# Patient Record
Sex: Female | Born: 1980 | Hispanic: No | Marital: Married | Smoking: Never smoker
Health system: Southern US, Community
[De-identification: ages and names within clinical notes are randomized; demographics above are authoritative.]

---

## 2017-12-09 ENCOUNTER — Ambulatory Visit (INDEPENDENT_AMBULATORY_CARE_PROVIDER_SITE_OTHER): Payer: Self-pay | Admitting: Obstetrics and Gynecology

## 2017-12-09 ENCOUNTER — Encounter: Payer: Self-pay | Admitting: Obstetrics and Gynecology

## 2017-12-09 VITALS — BP 114/78 | Wt 189.0 lb

## 2017-12-09 DIAGNOSIS — O09529 Supervision of elderly multigravida, unspecified trimester: Secondary | ICD-10-CM

## 2017-12-09 DIAGNOSIS — O09523 Supervision of elderly multigravida, third trimester: Secondary | ICD-10-CM

## 2017-12-09 DIAGNOSIS — Z3A31 31 weeks gestation of pregnancy: Secondary | ICD-10-CM

## 2017-12-09 DIAGNOSIS — Z3480 Encounter for supervision of other normal pregnancy, unspecified trimester: Secondary | ICD-10-CM

## 2017-12-09 NOTE — Progress Notes (Signed)
New Obstetric Patient H&P    Chief Complaint: "Desires prenatal care"   History of Present Illness: Patient is a 37 y.o. No obstetric history on file. Not Hispanic or Latino female, presents with amenorrhea and positive home pregnancy test. Based on her  LMP 04/30/2017, her Estimated Date of Delivery:02/04/2018. and her EGA is 31 weeks 6 days. She underwent ultrasound at 22 weeks at 10/02/2017 showing a single intrauterine pregnancy in vertex presentation, anterior placenta, normal amniotic fluid volume, and normal visualized fetal anatomy.  [place] in [place].     Her past medical history is noncontributory. Her prior pregnancies are notable for none  Since her LMP, she admits to the use of tobacco products  no There are cats in the home in the home  no  She admits close contact with children on a regular basis  yes  She has had chicken pox in the past unknown She has had Tuberculosis exposures, symptoms, or previously tested positive for TB   no Current or past history of domestic violence. no  Genetic Screening/Teratology Counseling: (Includes patient, baby's father, or anyone in either family with:)   1. [facility] 36 [facility]  yes 2. Thalassemia ([place], [place], Mediterranean, or Asian background): MCV<80  no 3. Neural tube defect (meningomyelocele, spina bifida, anencephaly)  no 4. Congenital heart defect  no  5. Down syndrome  no 6. Tay-Sachs (Jewish, [place])  no 7. Canavan's Disease  no 8. Sickle cell disease or trait (African)  no  9. Hemophilia or other blood disorders  no  10. Muscular dystrophy  no  11. Cystic fibrosis  no  12. Huntington's Chorea  no  13. Mental retardation/autism  no 14. Other inherited genetic or chromosomal disorder  no 15. Maternal metabolic disorder (DM, PKU, etc)  no 16. Patient or FOB with a child with a birth defect not listed above no  16a. Patient or FOB with a birth defect themselves no 17. Recurrent pregnancy  loss, or stillbirth  no  18. Any medications since LMP other than prenatal vitamins (include vitamins, supplements, OTC meds, drugs, alcohol)  no 19. Any other genetic/environmental exposure to discuss  no  Infection History:   1. Lives with someone with TB or TB exposed  no  2. Patient or partner has history of genital herpes  no 3. Rash or viral illness since LMP  no 4. History of STI (GC, CT, HPV, syphilis, HIV)  no 5. History of recent travel :  yes  Other pertinent information:  no     Review of Systems:10 point review of systems negative unless otherwise noted in HPI  Past Medical History:  History reviewed. No pertinent past medical history.  Past Surgical History:  History reviewed. No pertinent surgical history.  Gynecologic History: Patient's last menstrual period was 04/30/2017 (exact date).  Obstetric History: No obstetric history on file.  Family History:  History reviewed. No pertinent family history.  Social History:  Social History   Socioeconomic History  . Marital status: Married    Spouse name: Not on file  . Number of children: Not on file  . Years of education: Not on file  . Highest education level: Not on file  Occupational History  . Not on file  Social Needs  . Financial resource strain: Not on file  . Food insecurity:    Worry: Not on file    Inability: Not on file  . Transportation needs:    Medical: Not on  file    Non-medical: Not on file  Tobacco Use  . Smoking status: Never Smoker  . Smokeless tobacco: Never Used  Substance and Sexual Activity  . Alcohol use: Yes    Comment: Occ  . Drug use: Never  . Sexual activity: Yes  Lifestyle  . Physical activity:    Days per week: Not on file    Minutes per session: Not on file  . Stress: Not on file  Relationships  . Social connections:    Talks on phone: Not on file    Gets together: Not on file    Attends religious service: Not on file    Active member of club or organization:  Not on file    Attends meetings of clubs or organizations: Not on file    Relationship status: Not on file  . Intimate partner violence:    Fear of current or ex partner: Not on file    Emotionally abused: Not on file    Physically abused: Not on file    Forced sexual activity: Not on file  Other Topics Concern  . Not on file  Social History Narrative  . Not on file    Allergies:  No Known Allergies  Medications: Prior to Admission medications   Not on File    Physical Exam Blood pressure 114/78, weight 189 lb (85.7 kg), last menstrual period 04/30/2017.   General: NAD HEENT: normocephalic, anicteric Pulmonary: No increased work of breathing, CTAB Abdomen: Gravid, non-tender, fundal height 31cm, FHT 140 Neurologic: Grossly intact Psychiatric: mood appropriate, affect full   Assessment: 37 y.o. G4P3 at [redacted]w[redacted]d presenting to initiate prenatal care  Plan: 36) Avoid alcoholic beverages. 2) Patient encouraged not to smoke.  3) Discontinue the use of all non-medicinal drugs and chemicals.  4) Take prenatal vitamins daily.  5) Nutrition, food safety (fish, cheese advisories, and high nitrite foods) and exercise discussed. 6) Hospital and practice style discussed with cross coverage system.  7) Prenatal records reviewed.   8) Continue ASA for pre-eclampsia risk reduction 9) Has received tetanus vaccination x 2, malaria prophylaxis x 2 9) Return for labs and anatomy scan in 1 week  Vena Austria, MD, [facility], [facility]

## 2017-12-09 NOTE — Progress Notes (Signed)
NOB transfer  

## 2017-12-21 ENCOUNTER — Ambulatory Visit (INDEPENDENT_AMBULATORY_CARE_PROVIDER_SITE_OTHER): Payer: Self-pay

## 2017-12-21 ENCOUNTER — Ambulatory Visit (INDEPENDENT_AMBULATORY_CARE_PROVIDER_SITE_OTHER): Payer: Self-pay | Admitting: Obstetrics & Gynecology

## 2017-12-21 VITALS — BP 100/60 | Ht 66.0 in | Wt 184.0 lb

## 2017-12-21 DIAGNOSIS — O09523 Supervision of elderly multigravida, third trimester: Secondary | ICD-10-CM

## 2017-12-21 DIAGNOSIS — Z3A33 33 weeks gestation of pregnancy: Secondary | ICD-10-CM

## 2017-12-21 DIAGNOSIS — Z363 Encounter for antenatal screening for malformations: Secondary | ICD-10-CM

## 2017-12-21 DIAGNOSIS — O09529 Supervision of elderly multigravida, unspecified trimester: Secondary | ICD-10-CM

## 2017-12-21 DIAGNOSIS — Z3A31 31 weeks gestation of pregnancy: Secondary | ICD-10-CM

## 2017-12-21 DIAGNOSIS — Z3480 Encounter for supervision of other normal pregnancy, unspecified trimester: Secondary | ICD-10-CM

## 2017-12-21 DIAGNOSIS — O0993 Supervision of high risk pregnancy, unspecified, third trimester: Secondary | ICD-10-CM

## 2017-12-21 NOTE — Patient Instructions (Signed)
Third Trimester of Pregnancy The third trimester is from week 28 through week 40 (months 7 through 9). The third trimester is a time when the unborn baby (fetus) is growing rapidly. At the end of the ninth month, the fetus is about 20 inches in length and weighs 6-10 pounds. Body changes during your third trimester Your body will continue to go through many changes during pregnancy. The changes vary from woman to woman. During the third trimester:  Your weight will continue to increase. You can expect to gain 25-35 pounds (11-16 kg) by the end of the pregnancy.  You may begin to get stretch marks on your hips, abdomen, and breasts.  You may urinate more often because the fetus is moving lower into your pelvis and pressing on your bladder.  You may develop or continue to have heartburn. This is caused by increased hormones that slow down muscles in the digestive tract.  You may develop or continue to have constipation because increased hormones slow digestion and cause the muscles that push waste through your intestines to relax.  You may develop hemorrhoids. These are swollen veins (varicose veins) in the rectum that can itch or be painful.  You may develop swollen, bulging veins (varicose veins) in your legs.  You may have increased body aches in the pelvis, back, or thighs. This is due to weight gain and increased hormones that are relaxing your joints.  You may have changes in your hair. These can include thickening of your hair, rapid growth, and changes in texture. Some women also have hair loss during or after pregnancy, or hair that feels dry or thin. Your hair will most likely return to normal after your baby is born.  Your breasts will continue to grow and they will continue to become tender. A yellow fluid (colostrum) may leak from your breasts. This is the first milk you are producing for your baby.  Your belly button may stick out.  You may notice more swelling in your hands,  face, or ankles.  You may have increased tingling or numbness in your hands, arms, and legs. The skin on your belly may also feel numb.  You may feel short of breath because of your expanding uterus.  You may have more problems sleeping. This can be caused by the size of your belly, increased need to urinate, and an increase in your body's metabolism.  You may notice the fetus "dropping," or moving lower in your abdomen (lightening).  You may have increased vaginal discharge.  You may notice your joints feel loose and you may have pain around your pelvic bone.  What to expect at prenatal visits You will have prenatal exams every 2 weeks until week 36. Then you will have weekly prenatal exams. During a routine prenatal visit:  You will be weighed to make sure you and the baby are growing normally.  Your blood pressure will be taken.  Your abdomen will be measured to track your baby's growth.  The fetal heartbeat will be listened to.  Any test results from the previous visit will be discussed.  You may have a cervical check near your due date to see if your cervix has softened or thinned (effaced).  You will be tested for Group B streptococcus. This happens between 35 and 37 weeks.  Your health care provider may ask you:  What your birth plan is.  How you are feeling.  If you are feeling the baby move.  If you have had   any abnormal symptoms, such as leaking fluid, bleeding, severe headaches, or abdominal cramping.  If you are using any tobacco products, including cigarettes, chewing tobacco, and electronic cigarettes.  If you have any questions.  Other tests or screenings that may be performed during your third trimester include:  Blood tests that check for low iron levels (anemia).  Fetal testing to check the health, activity level, and growth of the fetus. Testing is done if you have certain medical conditions or if there are problems during the  pregnancy.  Nonstress test (NST). This test checks the health of your baby to make sure there are no signs of problems, such as the baby not getting enough oxygen. During this test, a belt is placed around your belly. The baby is made to move, and its heart rate is monitored during movement.  What is false labor? False labor is a condition in which you feel small, irregular tightenings of the muscles in the womb (contractions) that usually go away with rest, changing position, or drinking water. These are called Braxton Hicks contractions. Contractions may last for hours, days, or even weeks before true labor sets in. If contractions come at regular intervals, become more frequent, increase in intensity, or become painful, you should see your health care provider. What are the signs of labor?  Abdominal cramps.  Regular contractions that start at 10 minutes apart and become stronger and more frequent with time.  Contractions that start on the top of the uterus and spread down to the lower abdomen and back.  Increased pelvic pressure and dull back pain.  A watery or bloody mucus discharge that comes from the vagina.  Leaking of amniotic fluid. This is also known as your "water breaking." It could be a slow trickle or a gush. Let your health care provider know if it has a color or strange odor. If you have any of these signs, call your health care provider right away, even if it is before your due date. Follow these instructions at home: Medicines  Follow your health care provider's instructions regarding medicine use. Specific medicines may be either safe or unsafe to take during pregnancy.  Take a prenatal vitamin that contains at least 600 micrograms (mcg) of folic acid.  If you develop constipation, try taking a stool softener if your health care provider approves. Eating and drinking  Eat a balanced diet that includes fresh fruits and vegetables, whole grains, good sources of protein  such as meat, eggs, or tofu, and low-fat dairy. Your health care provider will help you determine the amount of weight gain that is right for you.  Avoid raw meat and uncooked cheese. These carry germs that can cause birth defects in the baby.  If you have low calcium intake from food, talk to your health care provider about whether you should take a daily calcium supplement.  Eat four or five small meals rather than three large meals a day.  Limit foods that are high in fat and processed sugars, such as fried and sweet foods.  To prevent constipation: ? Drink enough fluid to keep your urine clear or pale yellow. ? Eat foods that are high in fiber, such as fresh fruits and vegetables, whole grains, and beans. Activity  Exercise only as directed by your health care provider. Most women can continue their usual exercise routine during pregnancy. Try to exercise for 30 minutes at least 5 days a week. Stop exercising if you experience uterine contractions.  Avoid heavy   lifting.  Do not exercise in extreme heat or humidity, or at high altitudes.  Wear low-heel, comfortable shoes.  Practice good posture.  You may continue to have sex unless your health care provider tells you otherwise. Relieving pain and discomfort  Take frequent breaks and rest with your legs elevated if you have leg cramps or low back pain.  Take warm sitz baths to soothe any pain or discomfort caused by hemorrhoids. Use hemorrhoid cream if your health care provider approves.  Wear a good support bra to prevent discomfort from breast tenderness.  If you develop varicose veins: ? Wear support pantyhose or compression stockings as told by your healthcare provider. ? Elevate your feet for 15 minutes, 3-4 times a day. Prenatal care  Write down your questions. Take them to your prenatal visits.  Keep all your prenatal visits as told by your health care provider. This is important. Safety  Wear your seat belt at  all times when driving.  Make a list of emergency phone numbers, including numbers for family, friends, the hospital, and police and fire departments. General instructions  Avoid cat litter boxes and soil used by cats. These carry germs that can cause birth defects in the baby. If you have a cat, ask someone to clean the litter box for you.  Do not travel far distances unless it is absolutely necessary and only with the approval of your health care provider.  Do not use hot tubs, steam rooms, or saunas.  Do not drink alcohol.  Do not use any products that contain nicotine or tobacco, such as cigarettes and e-cigarettes. If you need help quitting, ask your health care provider.  Do not use any medicinal herbs or unprescribed drugs. These chemicals affect the formation and growth of the baby.  Do not douche or use tampons or scented sanitary pads.  Do not cross your legs for long periods of time.  To prepare for the arrival of your baby: ? Take prenatal classes to understand, practice, and ask questions about labor and delivery. ? Make a trial run to the hospital. ? Visit the hospital and tour the maternity area. ? Arrange for maternity or paternity leave through employers. ? Arrange for family and friends to take care of pets while you are in the hospital. ? Purchase a rear-facing car seat and make sure you know how to install it in your car. ? Pack your hospital bag. ? Prepare the baby's nursery. Make sure to remove all pillows and stuffed animals from the baby's crib to prevent suffocation.  Visit your dentist if you have not gone during your pregnancy. Use a soft toothbrush to brush your teeth and be gentle when you floss. Contact a health care provider if:  You are unsure if you are in labor or if your water has broken.  You become dizzy.  You have mild pelvic cramps, pelvic pressure, or nagging pain in your abdominal area.  You have lower back pain.  You have persistent  nausea, vomiting, or diarrhea.  You have an unusual or bad smelling vaginal discharge.  You have pain when you urinate. Get help right away if:  Your water breaks before 37 weeks.  You have regular contractions less than 5 minutes apart before 37 weeks.  You have a fever.  You are leaking fluid from your vagina.  You have spotting or bleeding from your vagina.  You have severe abdominal pain or cramping.  You have rapid weight loss or weight gain.    You have shortness of breath with chest pain.  You notice sudden or extreme swelling of your face, hands, ankles, feet, or legs.  Your baby makes fewer than 10 movements in 2 hours.  You have severe headaches that do not go away when you take medicine.  You have vision changes. Summary  The third trimester is from week 28 through week 40, months 7 through 9. The third trimester is a time when the unborn baby (fetus) is growing rapidly.  During the third trimester, your discomfort may increase as you and your baby continue to gain weight. You may have abdominal, leg, and back pain, sleeping problems, and an increased need to urinate.  During the third trimester your breasts will keep growing and they will continue to become tender. A yellow fluid (colostrum) may leak from your breasts. This is the first milk you are producing for your baby.  False labor is a condition in which you feel small, irregular tightenings of the muscles in the womb (contractions) that eventually go away. These are called Braxton Hicks contractions. Contractions may last for hours, days, or even weeks before true labor sets in.  Signs of labor can include: abdominal cramps; regular contractions that start at 10 minutes apart and become stronger and more frequent with time; watery or bloody mucus discharge that comes from the vagina; increased pelvic pressure and dull back pain; and leaking of amniotic fluid. This information is not intended to replace advice  given to you by your health care provider. Make sure you discuss any questions you have with your health care provider. Document Released: 04/15/2001 Document Revised: 09/27/2015 Document Reviewed: 06/22/2012 Elsevier Interactive Patient Education  2017 Elsevier Inc.  

## 2017-12-21 NOTE — Progress Notes (Signed)
  Subjective  Fetal Movement? yes Contractions? no Leaking Fluid? no Vaginal Bleeding? no  Objective  BP 100/60   Ht 5\' 6"  (1.676 m)   Wt 184 lb (83.5 kg)   LMP 04/30/2017 (Exact Date)   BMI 29.70 kg/m  General: NAD Pumonary: no increased work of breathing Abdomen: gravid, non-tender Extremities: no edema Psychiatric: mood appropriate, affect full  Assessment  37 y.o. G4P3 at 3765w4d by  02/04/2018, by Last Menstrual Period presenting for routine prenatal visit  Plan   Problem List Items Addressed This Visit    None    Visit Diagnoses    [redacted] weeks gestation of pregnancy    -  Primary   Relevant Orders   RPR+Rh+ABO+Rub Ab+Ab Scr+CB...   Hemoglobinopathy evaluation   AMA (advanced maternal age) multigravida 35+, third trimester       Relevant Orders   RPR+Rh+ABO+Rub Ab+Ab Scr+CB...   Hemoglobinopathy evaluation   Supervision of high risk pregnancy, antepartum, third trimester       Relevant Orders   RPR+Rh+ABO+Rub Ab+Ab Scr+CB...   Hemoglobinopathy evaluation    Glucola in [place] report normal; still waiting to see other labs Review of ULTRASOUND.    I have personally reviewed images and report of [facility].    Plan of management to be discussed with patient. Labor precautions discussed.  Pt to pre-register ar [facility]  Annamarie MajorPaul Harris, MD, [facility], [facility] 12/21/2017  11:13 AM

## 2018-01-05 ENCOUNTER — Ambulatory Visit (INDEPENDENT_AMBULATORY_CARE_PROVIDER_SITE_OTHER): Payer: Self-pay | Admitting: Maternal Newborn

## 2018-01-05 ENCOUNTER — Encounter: Payer: Self-pay | Admitting: Maternal Newborn

## 2018-01-05 VITALS — BP 100/60 | Wt 188.0 lb

## 2018-01-05 DIAGNOSIS — Z3A35 35 weeks gestation of pregnancy: Secondary | ICD-10-CM

## 2018-01-05 DIAGNOSIS — Z3480 Encounter for supervision of other normal pregnancy, unspecified trimester: Secondary | ICD-10-CM

## 2018-01-05 LAB — POCT URINALYSIS DIPSTICK OB: GLUCOSE, UA: NEGATIVE

## 2018-01-05 NOTE — Progress Notes (Signed)
    Routine Prenatal Care Visit  Subjective  Yvonne Montoya is a 37 y.o. G4P3 at [redacted]w[redacted]d being seen today for ongoing prenatal care.  She is currently monitored for the following issues for this low-risk pregnancy and has Supervision of other normal pregnancy, antepartum and Antepartum multigravida of advanced maternal age on their problem list.  ----------------------------------------------------------------------------------- Patient reports active fetal movement, some pains when baby is in certain positions. Increase in vaginal discharge. No itching, burning, or other vaginal symptoms..   Contractions: Not present. Vag. Bleeding: None.  Movement: Present. No leaking of fluid.  ----------------------------------------------------------------------------------- The following portions of the patient's history were reviewed and updated as appropriate: allergies, current medications, past family history, past medical history, past social history, past surgical history and problem list. Problem list updated.   Objective  Blood pressure 100/60, weight 188 lb (85.3 kg), last menstrual period 04/30/2017. Pregravid weight 172 lb (78 kg) Total Weight Gain 16 lb (7.258 kg) Body mass index is 30.34 kg/m. Urinalysis: Protein Trace, Glucose Negative  Fetal Status: Fetal Heart Rate (bpm): 145 Fundal Height: 34 cm Movement: Present     General:  Alert, oriented and cooperative. Patient is in no acute distress.  Skin: Skin is warm and dry. No rash noted.   Cardiovascular: Normal heart rate noted  Respiratory: Normal respiratory effort, no problems with respiration noted  Abdomen: Soft, gravid, appropriate for gestational age. Pain/Pressure: Absent     Pelvic:  Cervical exam deferred        Extremities: Normal range of motion.  Edema: None  Mental Status: Normal mood and affect. Normal behavior. Normal judgment and thought content.     Assessment   37 y.o. G4P3 at [redacted]w[redacted]d, EDD 02/04/2018 by Last  Menstrual Period presenting for a routine prenatal visit.  Plan   Pregnancy#4 Problems (from 04/30/17 to present)    Problem Noted Resolved   Supervision of other normal pregnancy, antepartum 12/09/2017 by Vena [place], MD No   Overview Addendum 01/05/2018 10:50 AM by Oswaldo Conroy, CNM    [facility]inic [facility] Prenatal Labs  Dating  Blood type:     Genetic Screen 1 Screen:    AFP:     Quad:     NIPS: Antibody:   Anatomic [place]  Rubella:   Varicella: @VZVIGG @  GTT Early:               Third trimester:  RPR:     Rhogam  HBsAg:     TDaP vaccine                       Flu Shot: HIV:     Baby Food                                GBS:   Contraception  Pap:  CBB     CS/VBAC    Support Person             GBS done, declines GC and does not want swab to check for BV today.  Advised to pre-register at hospital and written information given with hospital numbers and instructions.    Preterm labor symptoms and general obstetric precautions including but not limited to vaginal bleeding, contractions, leaking of fluid and fetal movement were reviewed.  Return in about 1 week (around 01/12/2018) for ROB.  Marcelyn Bruins, CNM 01/05/2018  11:10 AM

## 2018-01-07 LAB — STREP GP B NAA: Strep Gp B NAA: POSITIVE — AB

## 2018-01-12 ENCOUNTER — Ambulatory Visit (INDEPENDENT_AMBULATORY_CARE_PROVIDER_SITE_OTHER): Payer: Self-pay | Admitting: Obstetrics & Gynecology

## 2018-01-12 VITALS — BP 100/60 | Wt 178.0 lb

## 2018-01-12 DIAGNOSIS — Z3A36 36 weeks gestation of pregnancy: Secondary | ICD-10-CM

## 2018-01-12 DIAGNOSIS — O09529 Supervision of elderly multigravida, unspecified trimester: Secondary | ICD-10-CM

## 2018-01-12 DIAGNOSIS — Z3480 Encounter for supervision of other normal pregnancy, unspecified trimester: Secondary | ICD-10-CM

## 2018-01-12 NOTE — Patient Instructions (Signed)
Braxton Hicks Contractions °Contractions of the uterus can occur throughout pregnancy, but they are not always a sign that you are in labor. You may have practice contractions called Braxton Hicks contractions. These false labor contractions are sometimes confused with true labor. °What are Braxton Hicks contractions? °Braxton Hicks contractions are tightening movements that occur in the muscles of the uterus before labor. Unlike true labor contractions, these contractions do not result in opening (dilation) and thinning of the cervix. Toward the end of pregnancy (32-34 weeks), Braxton Hicks contractions can happen more often and may become stronger. These contractions are sometimes difficult to tell apart from true labor because they can be very uncomfortable. You should not feel embarrassed if you go to the hospital with false labor. °Sometimes, the only way to tell if you are in true labor is for your health care provider to look for changes in the cervix. The health care provider will do a physical exam and may monitor your contractions. If you are not in true labor, the exam should show that your cervix is not dilating and your water has not broken. °If there are other health problems associated with your pregnancy, it is completely safe for you to be sent home with false labor. You may continue to have Braxton Hicks contractions until you go into true labor. °How to tell the difference between true labor and false labor °True labor °· Contractions last 30-70 seconds. °· Contractions become very regular. °· Discomfort is usually felt in the top of the uterus, and it spreads to the lower abdomen and low back. °· Contractions do not go away with walking. °· Contractions usually become more intense and increase in frequency. °· The cervix dilates and gets thinner. °False labor °· Contractions are usually shorter and not as strong as true labor contractions. °· Contractions are usually irregular. °· Contractions  are often felt in the front of the lower abdomen and in the groin. °· Contractions may go away when you walk around or change positions while lying down. °· Contractions get weaker and are shorter-lasting as time goes on. °· The cervix usually does not dilate or become thin. °Follow these instructions at home: °· Take over-the-counter and prescription medicines only as told by your health care provider. °· Keep up with your usual exercises and follow other instructions from your health care provider. °· Eat and drink lightly if you think you are going into labor. °· If Braxton Hicks contractions are making you uncomfortable: °? Change your position from lying down or resting to walking, or change from walking to resting. °? Sit and rest in a tub of warm water. °? Drink enough fluid to keep your urine pale yellow. Dehydration may cause these contractions. °? Do slow and deep breathing several times an hour. °· Keep all follow-up prenatal visits as told by your health care provider. This is important. °Contact a health care provider if: °· You have a fever. °· You have continuous pain in your abdomen. °Get help right away if: °· Your contractions become stronger, more regular, and closer together. °· You have fluid leaking or gushing from your vagina. °· You pass blood-tinged mucus (bloody show). °· You have bleeding from your vagina. °· You have low back pain that you never had before. °· You feel your baby’s head pushing down and causing pelvic pressure. °· Your baby is not moving inside you as much as it used to. °Summary °· Contractions that occur before labor are called Braxton   Hicks contractions, false labor, or practice contractions. °· Braxton Hicks contractions are usually shorter, weaker, farther apart, and less regular than true labor contractions. True labor contractions usually become progressively stronger and regular and they become more frequent. °· Manage discomfort from Braxton Hicks contractions by  changing position, resting in a warm bath, drinking plenty of water, or practicing deep breathing. °This information is not intended to replace advice given to you by your health care provider. Make sure you discuss any questions you have with your health care provider. °Document Released: 09/04/2016 Document Revised: 09/04/2016 Document Reviewed: 09/04/2016 °Elsevier Interactive Patient Education © 2018 [employer]vier Inc. ° °

## 2018-01-12 NOTE — Progress Notes (Signed)
  Subjective  Fetal Movement? yes Contractions? no Leaking Fluid? no Vaginal Bleeding? no  Objective  BP 100/60   Wt 178 lb (80.7 kg)   LMP 04/30/2017 (Exact Date)   BMI 28.73 kg/m  General: NAD Pumonary: no increased work of breathing Abdomen: gravid, non-tender Extremities: no edema Psychiatric: mood appropriate, affect full  Assessment  37 y.o. G4P3 at [redacted]w[redacted]d by  02/04/2018, by Last Menstrual Period presenting for routine prenatal visit  Plan   Problem List Items Addressed This Visit      Other   Supervision of other normal pregnancy, antepartum   Antepartum multigravida of advanced maternal age    Other Visit Diagnoses    [redacted] weeks gestation of pregnancy    -  Primary    Monitor for labor Needs GBS tx in labor  Annamarie Major, MD, [facility], [facility] 01/12/2018  10:35 AM

## 2018-01-19 ENCOUNTER — Ambulatory Visit (INDEPENDENT_AMBULATORY_CARE_PROVIDER_SITE_OTHER): Payer: Self-pay | Admitting: Obstetrics and Gynecology

## 2018-01-19 VITALS — BP 110/66 | Wt 180.0 lb

## 2018-01-19 DIAGNOSIS — Z3480 Encounter for supervision of other normal pregnancy, unspecified trimester: Secondary | ICD-10-CM

## 2018-01-19 DIAGNOSIS — O09529 Supervision of elderly multigravida, unspecified trimester: Secondary | ICD-10-CM

## 2018-01-19 DIAGNOSIS — Z3A37 37 weeks gestation of pregnancy: Secondary | ICD-10-CM

## 2018-01-19 LAB — POCT URINALYSIS DIPSTICK OB: Glucose, UA: NEGATIVE

## 2018-01-19 NOTE — Progress Notes (Signed)
    Routine Prenatal Care Visit  Subjective  Yvonne Montoya is a 37 y.o. G4P3 at 9075w5d being seen today for ongoing prenatal care.  She is currently monitored for the following issues for this high-risk pregnancy and has Supervision of other normal pregnancy, antepartum and Antepartum multigravida of advanced maternal age on their problem list.  ----------------------------------------------------------------------------------- Patient reports no complaints.  Scant bleeding when wiping after toileting  Contractions: Irregular. Vag. Bleeding: Scant.  Movement: Present. Denies leaking of fluid.  ----------------------------------------------------------------------------------- The following portions of the patient's history were reviewed and updated as appropriate: allergies, current medications, past family history, past medical history, past social history, past surgical history and problem list. Problem list updated.   Objective  Blood pressure 110/66, weight 180 lb (81.6 kg), last menstrual period 04/30/2017. Pregravid weight 172 lb (78 kg) Total Weight Gain 8 lb (3.629 kg) Urinalysis:      Fetal Status: Fetal Heart Rate (bpm): 150 Fundal Height: 37 cm Movement: Present  Presentation: Vertex  General:  Alert, oriented and cooperative. Patient is in no acute distress.  Skin: Skin is warm and dry. No rash noted.   Cardiovascular: Normal heart rate noted  Respiratory: Normal respiratory effort, no problems with respiration noted  Abdomen: Soft, gravid, appropriate for gestational age. Pain/Pressure: Present     Pelvic:  Cervical exam performed Dilation: 1.5 Effacement (%): 50 Station: -3 no bleeding noted  Extremities: Normal range of motion.     ental Status: Normal mood and affect. Normal behavior. Normal judgment and thought content.     Assessment   37 y.o. G4P3 at 4275w5d by  02/04/2018, by Last Menstrual Period presenting for routine prenatal visit  Plan   Pregnancy#4  Problems (from 04/30/17 to present)    Problem Noted Resolved   Supervision of other normal pregnancy, antepartum 12/09/2017 by Vena AustriaStaebler, Rafia Shedden, MD No   Overview Addendum 01/19/2018 11:31 AM by Vena AustriaStaebler, Harim Bi, MD    Clinic [facility] Prenatal Labs  Dating LMP = 33 week US Blood type: A positive  Genetic Screen Declines Antibody: negative  Anatomic [place] Normal scan in [place], Ultrasound here incomplete for cardiac views  Rubella:   Varicella: @VZVIGG @  GTT Normal in [place] RPR: NR  Rhogam  HBsAg: Negative  TDaP vaccine                       Flu Shot: HIV:  Negative  Baby Food                                [id number]:Positive (09/03 1121)  Contraception  Pap: obtain postpartum  CBB     CS/VBAC N/A   Support Person                Gestational age appropriate obstetric precautions including but not limited to vaginal bleeding, contractions, leaking of fluid and fetal movement were reviewed in detail with the patient.    Return in about 1 year (around 01/20/2019) for ROB.  Vena AustriaAndreas Joas Motton, MD, [facility]linda FrederickFACOG [facility], [facility] 01/19/2018, 11:41 AM

## 2018-01-19 NOTE — Progress Notes (Signed)
ROB Discharge/bleeding cramping

## 2018-01-27 ENCOUNTER — Ambulatory Visit (INDEPENDENT_AMBULATORY_CARE_PROVIDER_SITE_OTHER): Payer: Self-pay | Admitting: Obstetrics & Gynecology

## 2018-01-27 VITALS — BP 100/60 | Wt 184.0 lb

## 2018-01-27 DIAGNOSIS — O09529 Supervision of elderly multigravida, unspecified trimester: Secondary | ICD-10-CM

## 2018-01-27 DIAGNOSIS — O09523 Supervision of elderly multigravida, third trimester: Secondary | ICD-10-CM

## 2018-01-27 DIAGNOSIS — Z3A38 38 weeks gestation of pregnancy: Secondary | ICD-10-CM

## 2018-01-27 DIAGNOSIS — Z3480 Encounter for supervision of other normal pregnancy, unspecified trimester: Secondary | ICD-10-CM

## 2018-01-27 DIAGNOSIS — Z23 Encounter for immunization: Secondary | ICD-10-CM

## 2018-01-27 MED ORDER — PROVIDA DHA 16-16-1.25-110 MG PO CAPS: 1.0000 | ORAL_CAPSULE | Freq: Every day | ORAL | 11 refills | Status: AC

## 2018-01-27 NOTE — Addendum Note (Signed)
Addended by: Nadara Mustard on: 01/27/2018 12:05 PM   Modules accepted: Orders

## 2018-01-27 NOTE — Progress Notes (Signed)
  Subjective  Fetal Movement? yes Contractions? no Leaking Fluid? no Vaginal Bleeding? no  Objective  BP 100/60   Wt 184 lb (83.5 kg)   LMP 04/30/2017 (Exact Date)   BMI 29.70 kg/m  General: NAD Pumonary: no increased work of breathing Abdomen: gravid, non-tender Extremities: no edema Psychiatric: mood appropriate, affect full  Assessment  37 y.o. G4P3 at 2050w6d by  02/04/2018, by Last Menstrual Period presenting for routine prenatal visit  Plan   Problem List Items Addressed This Visit      Other   Supervision of other normal pregnancy, antepartum   Antepartum multigravida of advanced maternal age    Other Visit Diagnoses    [redacted] weeks gestation of pregnancy    -  Primary    PNV, FMC, Labor precautions  Annamarie MajorPaul Charis Juliana, MD, FACOG [facility], [facility] 01/27/2018  11:30 AM

## 2018-01-27 NOTE — Patient Instructions (Signed)

## 2018-02-03 ENCOUNTER — Ambulatory Visit (INDEPENDENT_AMBULATORY_CARE_PROVIDER_SITE_OTHER): Payer: Self-pay | Admitting: Obstetrics and Gynecology

## 2018-02-03 ENCOUNTER — Encounter: Payer: Self-pay | Admitting: Obstetrics and Gynecology

## 2018-02-03 VITALS — BP 114/74 | Wt 184.0 lb

## 2018-02-03 DIAGNOSIS — Z3480 Encounter for supervision of other normal pregnancy, unspecified trimester: Secondary | ICD-10-CM

## 2018-02-03 DIAGNOSIS — O09523 Supervision of elderly multigravida, third trimester: Secondary | ICD-10-CM

## 2018-02-03 DIAGNOSIS — O09529 Supervision of elderly multigravida, unspecified trimester: Secondary | ICD-10-CM

## 2018-02-03 DIAGNOSIS — Z3A39 39 weeks gestation of pregnancy: Secondary | ICD-10-CM

## 2018-02-03 NOTE — Progress Notes (Signed)
  [place] [facility] INDU[facility]ESS[facility] Yvonne Montoya 16-Mar-1981 Medical record #: [id number] Phone #:  Home Phone [phone]  Mobile [phone]    [facility]atal Provider:[facility]ivering Group[facility] Proposed admission date/time:02/08/2018 at 0800 Method of induction:Cytotec  Weight: Filed Weights10/02/19 1425Weight:184 lb (83.5 kg) BMI Body mass index is 29.7 kg/m. HIV Negative HSV Negative EDC Estimated Date of Delivery: 10/3/19based on:LMP  Gestational age on admission: 40 weeks Gravidity/parity:G4P3  Cervix Score   0 1 2 3   Position Posterior Midposition Anterior   Consistency Firm Medium Soft   Effacement (%) 0-30 40-50 60-70 >80  Dilation (cm) Closed 1-2 3-4 >5  Baby's station -3 -2 -1 +1, +2   Bishop Score:3  select indication(s) below Elective induction ?40 weeks multiparous patient  Medical Indications Adapted from ACOG Committee Opinion #[id number], "Medically Indicated Late Preterm and Early Term Deliveries," 2013.  PLACENTAL / UTERINE ISSUES FETAL ISSUES MATERNAL ISSUES  ? 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 Isoimmunization (37.0-38.6) ? Preeclampsia without severe features or gestational HTN (37.0)  ? Suspected accreta (34.0-35.6) ? Growth Restriction [name]) ? Preeclampsia with severe features (34.0)  ? Prior classical CD, uterine window, rupture (36.0-37.6) ? Isolated (38.0-39.6) ? Chronic HTN (38.0-39.6)  ? Prior myomectomy (37.0-38.6) ? Concurrent findings (34.0-37.6) ? Cholestasis (37.0)  ? Umbilical vein varix (37.0) ? Growth Restriction (Twins) ? Diabetes  ? Placental abruption (chronic) ? Di-Di Isolated (36.0-37.6) ? Pregestational, controlled (39.0)  OBSTETRIC ISSUES ? Di-Di concurrent findings (32.0-34.6) ? Pregestational, uncontrolled (37.0-39.0)  ? Postdates ? (41 weeks) ? Mo-Di isolated (32.0-34.6) ? Pregestational, vascular compromise (37.0- 39.0)  ? PPROM (34.0) ? Multiple Gestation ? Gestational, diet controlled (40.0)   ? Hx of IUFD (39.0 weeks) ? Di-Di (38.0-38.6) ? Gestational, med controlled (39.0)  ? Polyhydramnios, mild/moderate; SDV 8-16 or AFI 25-35 (39.0) ? Mo-Di (36.0-37.6) ? Gestational, uncontrolled (38.0-39.0)  ? Oligohydramnios (36.0-37.6); MVP <2 cm  For indications not listed above, delivery recommendations from maternal-fetal medicine consultant occurred on: Date:n/a with Dr. Gretta Cool for indication of:n/a  Provider Signature: Thomasene Mohair Scheduled by: [name], RN Date:02/03/2018 5:30 PM   Call [phone] to finalize the induction date/time  [id number] (07/17)

## 2018-02-03 NOTE — Progress Notes (Signed)
OB History & Physical   History of Present Illness:  Chief Complaint: Induction of Labor  HPI:  Yvonne Montoya is a 37 y.o. G4P3 female at [redacted]w[redacted]d dated by LMP consistent with 22 week ultrasound.  Her pregnancy has been complicated by advanced maternal age.    She denies contractions.   She denies leakage of fluid.   She denies vaginal bleeding.   She reports fetal movement.    Maternal Medical History:  No past medical history on file.  No past surgical history on file.  No Known Allergies  Prior to Admission medications   Medication Sig Start Date End Date Taking? Authorizing Provider  aspirin 81 MG chewable tablet Chew by mouth daily.    [provider]  Prenat-FeFum-FePo-FA-DHA w/o A (PROVIDA DHA) 16-16-1.25-110 MG CAPS Take 1 capsule by mouth daily. 01/27/18   Nadara Mustard, MD    OB History  Gravida Para Term Preterm AB Living  4 3       3   SAB TAB Ectopic Multiple Live Births               # Outcome Date GA Lbr Len/2nd Weight Sex Delivery Anes PTL Lv  4 Current           3 Para           2 Para           1 Para             Prenatal care site: [facility] starting late in pregnancy. She began her care in [place].  Social History: She  reports that she has never smoked. She has never used smokeless tobacco. She reports that she drinks alcohol. She reports that she does not use drugs.  Family History: She denies a family of gynecologic cancers  Review of Systems: Negative x 10 systems reviewed except as noted in the HPI.    Physical Exam:  Vital Signs: BP 114/74   Wt 184 lb (83.5 kg)   LMP 04/30/2017 (Exact Date)   BMI 29.70 kg/m  Constitutional: Well nourished, well developed female in no acute distress.  HEENT: normal Skin: Warm and dry.  Cardiovascular: Regular rate and rhythm.   Extremity: no edema  Respiratory: Clear to auscultation bilateral. Normal respiratory effort Abdomen: FHT present and gravid/NT Back: no CVAT Neuro: DTRs 2+,  Cranial nerves grossly intact Psych: Alert and Oriented x3. No memory deficits. Normal mood and affect.  MS: normal gait, normal bilateral lower extremity ROM/strength/stability.  Pelvic exam: (female chaperone present) Cervix: 1/40/-3/soft-mid/high  Pertinent Results:  Prenatal Labs: Blood type/Rh A positive  Antibody screen negative  Rubella Immune  Varicella Unknown    RPR NR (VDRL negative)  HBsAg negative  HIV negative  GC unknown  Chlamydia unknown  Genetic screening Did not have done  1 hour GTT passed  3 hour GTT n/a  GBS positive    Assessment:  Yvonne Montoya is a 37 y.o. G4P3 female at [redacted]w[redacted]d with scheduled induction of labor, elective. Scheduled for 02/08/18 at 8AM.   Plan:  1. Admit to Labor & Delivery  2. CBC, T&S, Clrs, IVF 3. GBS positive - PCN  Thomasene Mohair, MD 02/03/2018 5:33 PM

## 2018-02-03 NOTE — Progress Notes (Signed)
  Routine Prenatal Care Visit  Subjective  Yvonne Montoya is a 37 y.o. G4P3 at [redacted]w[redacted]d being seen today for ongoing prenatal care.  She is currently monitored for the following issues for this high-risk pregnancy and has Supervision of other normal pregnancy, antepartum and Antepartum multigravida of advanced maternal age on their problem list.  ----------------------------------------------------------------------------------- Patient reports no complaints.   Contractions: Irregular. Vag. Bleeding: None.  Movement: Present. Denies leaking of fluid.  ----------------------------------------------------------------------------------- The following portions of the patient's history were reviewed and updated as appropriate: allergies, current medications, past family history, past medical history, past social history, past surgical history and problem list. Problem list updated.   Objective  Blood pressure 114/74, weight 184 lb (83.5 kg), last menstrual period 04/30/2017. Pregravid weight 172 lb (78 kg) Total Weight Gain 12 lb (5.443 kg) Urinalysis: Urine Protein    Urine Glucose    Fetal Status: Fetal Heart Rate (bpm): 150   Movement: Present  Presentation: Vertex  General:  Alert, oriented and cooperative. Patient is in no acute distress.  Skin: Skin is warm and dry. No rash noted.   Cardiovascular: Normal heart rate noted  Respiratory: Normal respiratory effort, no problems with respiration noted  Abdomen: Soft, gravid, appropriate for gestational age. Pain/Pressure: Absent     Pelvic:  Cervical exam performed Dilation: 1 Effacement (%): 40 Station: -3  Extremities: Normal range of motion.  Edema: None  Mental Status: Normal mood and affect. Normal behavior. Normal judgment and thought content.   Assessment   37 y.o. G4P3 at [redacted]w[redacted]d by  02/04/2018, by Last Menstrual Period presenting for routine prenatal visit  Plan   Pregnancy#4 Problems (from 04/30/17 to present)    Problem Noted  Resolved   Supervision of other normal pregnancy, antepartum 12/09/2017 by Vena Austria, MD No   Overview Addendum 01/19/2018 11:33 AM by Vena Austria, MD    Clinic [facility] Prenatal Labs  Dating LMP = 33 week [place] Blood type: A positive  Genetic Screen Declines Antibody: negative  Anatomic [place] Normal scan in [place], Ultrasound here incomplete for cardiac views  Rubella:   Varicella: @VZVIGG @  GTT Normal in [place] RPR: NR  Rhogam  HBsAg: Negative  TDaP vaccine                       Flu Shot: HIV:  Negative  Baby Food                                [id number] (09/03 1121)  Contraception  Pap: obtain postpartum  CBB     CS/VBAC N/A   Support Person                Term labor symptoms and general obstetric precautions including but not limited to vaginal bleeding, contractions, leaking of fluid and fetal movement were reviewed in detail with the patient. Please refer to After Visit Summary for other counseling recommendations.   - IOL scheduled for 02/08/18 at 0800, per patient request.    Thomasene Mohair, MD, [facility], [facility] 02/03/2018 5:32 PM

## 2018-02-04 ENCOUNTER — Inpatient Hospital Stay: Admission: RE | Admit: 2018-02-04 | Payer: Self-pay | Source: Ambulatory Visit | Admitting: *Deleted

## 2018-02-08 ENCOUNTER — Other Ambulatory Visit: Payer: Self-pay

## 2018-02-08 ENCOUNTER — Encounter
Admission: EM | Disposition: A | Discharge status: Home / Self Care | Payer: Self-pay | Source: Home / Self Care | Attending: Maternal Newborn

## 2018-02-08 ENCOUNTER — Inpatient Hospital Stay: Payer: Self-pay

## 2018-02-08 ENCOUNTER — Inpatient Hospital Stay: Payer: Self-pay | Admitting: Anesthesiology

## 2018-02-08 ENCOUNTER — Inpatient Hospital Stay: Admission: RE | Admit: 2018-02-08 | Payer: Self-pay | Source: Home / Self Care

## 2018-02-08 ENCOUNTER — Inpatient Hospital Stay
Admission: EM | Admit: 2018-02-08 | Discharge: 2018-02-11 | DRG: 786 | Disposition: A | Discharge status: Home / Self Care | Payer: Self-pay | Attending: Maternal Newborn | Admitting: Maternal Newborn

## 2018-02-08 DIAGNOSIS — Z3A40 40 weeks gestation of pregnancy: Secondary | ICD-10-CM

## 2018-02-08 DIAGNOSIS — R000 Tachycardia, unspecified: Secondary | ICD-10-CM | POA: Diagnosis not present

## 2018-02-08 DIAGNOSIS — O09523 Supervision of elderly multigravida, third trimester: Secondary | ICD-10-CM

## 2018-02-08 DIAGNOSIS — R0682 Tachypnea, not elsewhere classified: Secondary | ICD-10-CM | POA: Diagnosis not present

## 2018-02-08 DIAGNOSIS — O9081 Anemia of the puerperium: Secondary | ICD-10-CM | POA: Diagnosis not present

## 2018-02-08 DIAGNOSIS — O4593 Premature separation of placenta, unspecified, third trimester: Secondary | ICD-10-CM | POA: Diagnosis present

## 2018-02-08 DIAGNOSIS — J029 Acute pharyngitis, unspecified: Secondary | ICD-10-CM | POA: Diagnosis not present

## 2018-02-08 DIAGNOSIS — O99892 Other specified diseases and conditions complicating childbirth: Secondary | ICD-10-CM

## 2018-02-08 DIAGNOSIS — O9089 Other complications of the puerperium, not elsewhere classified: Secondary | ICD-10-CM | POA: Diagnosis not present

## 2018-02-08 DIAGNOSIS — O99824 Streptococcus B carrier state complicating childbirth: Secondary | ICD-10-CM | POA: Diagnosis present

## 2018-02-08 DIAGNOSIS — D62 Acute posthemorrhagic anemia: Secondary | ICD-10-CM | POA: Diagnosis not present

## 2018-02-08 DIAGNOSIS — Z3490 Encounter for supervision of normal pregnancy, unspecified, unspecified trimester: Secondary | ICD-10-CM | POA: Diagnosis present

## 2018-02-08 DIAGNOSIS — O480 Post-term pregnancy: Principal | ICD-10-CM | POA: Diagnosis present

## 2018-02-08 DIAGNOSIS — Z3480 Encounter for supervision of other normal pregnancy, unspecified trimester: Secondary | ICD-10-CM

## 2018-02-08 LAB — CHLAMYDIA/NGC RT PCR (ARMC ONLY)
Chlamydia Tr: NOT DETECTED
N gonorrhoeae: NOT DETECTED

## 2018-02-08 LAB — CBC
HCT: 34.6 % — ABNORMAL LOW (ref 35.0–47.0)
HEMOGLOBIN: 11.7 g/dL — AB (ref 12.0–16.0)
MCH: 29.5 pg (ref 26.0–34.0)
MCHC: 33.9 g/dL (ref 32.0–36.0)
MCV: 86.9 fL (ref 80.0–100.0)
Platelets: 266 10*3/uL (ref 150–440)
RBC: 3.98 MIL/uL (ref 3.80–5.20)
RDW: 16.4 % — ABNORMAL HIGH (ref 11.5–14.5)
WBC: 6 10*3/uL (ref 3.6–11.0)

## 2018-02-08 SURGERY — Surgical Case
Anesthesia: Epidural | Site: Abdomen

## 2018-02-08 MED ORDER — WITCH HAZEL-GLYCERIN EX PADS: 1.0000 "application " | MEDICATED_PAD | CUTANEOUS | Status: DC | PRN

## 2018-02-08 MED ORDER — LIDOCAINE-EPINEPHRINE (PF) 1.5 %-1:200000 IJ SOLN
INTRAMUSCULAR | Status: DC | PRN
  Administered 2018-02-08: 3 mL via PERINEURAL

## 2018-02-08 MED ORDER — LIDOCAINE HCL (PF) 1 % IJ SOLN
INTRAMUSCULAR | Status: AC
  Filled 2018-02-08: qty 30

## 2018-02-08 MED ORDER — ONDANSETRON HCL 4 MG/2ML IJ SOLN
4.0000 mg | Freq: Four times a day (QID) | INTRAMUSCULAR | Status: DC | PRN
  Filled 2018-02-08: qty 2

## 2018-02-08 MED ORDER — OXYTOCIN 10 UNIT/ML IJ SOLN
INTRAMUSCULAR | Status: AC
  Filled 2018-02-08: qty 2

## 2018-02-08 MED ORDER — EPHEDRINE 5 MG/ML INJ: 10.0000 mg | INTRAVENOUS | Status: DC | PRN

## 2018-02-08 MED ORDER — PRENATAL MULTIVITAMIN CH
1.0000 | ORAL_TABLET | Freq: Every day | ORAL | Status: DC
  Administered 2018-02-09 – 2018-02-11 (×3): 1 via ORAL
  Filled 2018-02-08 (×3): qty 1

## 2018-02-08 MED ORDER — SODIUM CHLORIDE 0.9 % IJ SOLN
INTRAMUSCULAR | Status: AC
  Filled 2018-02-08: qty 50

## 2018-02-08 MED ORDER — BUPIVACAINE HCL (PF) 0.25 % IJ SOLN
INTRAMUSCULAR | Status: DC | PRN
  Administered 2018-02-08 (×2): 4 mL via EPIDURAL

## 2018-02-08 MED ORDER — LACTATED RINGERS IV SOLN: 500.0000 mL | Freq: Once | INTRAVENOUS | Status: DC

## 2018-02-08 MED ORDER — ONDANSETRON HCL 4 MG/2ML IJ SOLN: 4.0000 mg | Freq: Once | INTRAMUSCULAR | Status: DC | PRN

## 2018-02-08 MED ORDER — ONDANSETRON HCL 4 MG/2ML IJ SOLN
4.0000 mg | Freq: Four times a day (QID) | INTRAMUSCULAR | Status: DC | PRN
  Administered 2018-02-08: 4 mg via INTRAVENOUS
  Filled 2018-02-08: qty 2

## 2018-02-08 MED ORDER — SODIUM CHLORIDE 0.9 % IJ SOLN
INTRAMUSCULAR | Status: DC | PRN
  Administered 2018-02-08: 50 mL

## 2018-02-08 MED ORDER — MISOPROSTOL 25 MCG QUARTER TABLET
25.0000 ug | ORAL_TABLET | ORAL | Status: DC | PRN
  Administered 2018-02-08 (×2): 25 ug via VAGINAL
  Filled 2018-02-08 (×2): qty 1

## 2018-02-08 MED ORDER — OXYTOCIN 40 UNITS IN LACTATED RINGERS INFUSION - SIMPLE MED: 1.0000 m[IU]/min | INTRAVENOUS | Status: DC

## 2018-02-08 MED ORDER — MISOPROSTOL 200 MCG PO TABS
ORAL_TABLET | ORAL | Status: AC
  Filled 2018-02-08: qty 4

## 2018-02-08 MED ORDER — HYDROMORPHONE 1 MG/ML IV SOLN: INTRAVENOUS | Status: DC

## 2018-02-08 MED ORDER — OXYTOCIN BOLUS FROM INFUSION: 500.0000 mL | Freq: Once | INTRAVENOUS | Status: DC

## 2018-02-08 MED ORDER — NALOXONE HCL 0.4 MG/ML IJ SOLN: 0.4000 mg | INTRAMUSCULAR | Status: DC | PRN

## 2018-02-08 MED ORDER — PROPOFOL 10 MG/ML IV BOLUS
INTRAVENOUS | Status: DC | PRN
  Administered 2018-02-08: 150 mg via INTRAVENOUS

## 2018-02-08 MED ORDER — HYDROMORPHONE HCL 1 MG/ML IJ SOLN
1.0000 mg | INTRAMUSCULAR | Status: DC | PRN
  Administered 2018-02-09: 1 mg via INTRAVENOUS
  Filled 2018-02-08: qty 1

## 2018-02-08 MED ORDER — MENTHOL 3 MG MT LOZG
1.0000 | LOZENGE | OROMUCOSAL | Status: DC | PRN
  Administered 2018-02-09: 3 mg via ORAL
  Filled 2018-02-08 (×2): qty 9

## 2018-02-08 MED ORDER — SENNOSIDES-DOCUSATE SODIUM 8.6-50 MG PO TABS
2.0000 | ORAL_TABLET | ORAL | Status: DC
  Administered 2018-02-09 – 2018-02-11 (×3): 2 via ORAL
  Filled 2018-02-08 (×3): qty 2

## 2018-02-08 MED ORDER — DIBUCAINE 1 % RE OINT: 1.0000 "application " | TOPICAL_OINTMENT | RECTAL | Status: DC | PRN

## 2018-02-08 MED ORDER — SIMETHICONE 80 MG PO CHEW
80.0000 mg | CHEWABLE_TABLET | ORAL | Status: DC
  Filled 2018-02-08: qty 1

## 2018-02-08 MED ORDER — BUPIVACAINE LIPOSOME 1.3 % IJ SUSP
INTRAMUSCULAR | Status: DC | PRN
  Administered 2018-02-08: 20:00:00

## 2018-02-08 MED ORDER — FENTANYL 2.5 MCG/ML W/ROPIVACAINE 0.15% IN NS 100 ML EPIDURAL (ARMC)
EPIDURAL | Status: AC
  Filled 2018-02-08: qty 100

## 2018-02-08 MED ORDER — CEFAZOLIN SODIUM-DEXTROSE 2-3 GM-%(50ML) IV SOLR
INTRAVENOUS | Status: DC | PRN
  Administered 2018-02-08: 2 g via INTRAVENOUS

## 2018-02-08 MED ORDER — LIDOCAINE HCL (PF) 1 % IJ SOLN
INTRAMUSCULAR | Status: DC | PRN
  Administered 2018-02-08: 3 mL

## 2018-02-08 MED ORDER — IBUPROFEN 600 MG PO TABS: 600.0000 mg | ORAL_TABLET | Freq: Four times a day (QID) | ORAL | Status: DC

## 2018-02-08 MED ORDER — SOD CITRATE-CITRIC ACID 500-334 MG/5ML PO SOLN
ORAL | Status: AC
  Filled 2018-02-08: qty 30

## 2018-02-08 MED ORDER — OXYTOCIN 40 UNITS IN LACTATED RINGERS INFUSION - SIMPLE MED
2.5000 [IU]/h | INTRAVENOUS | Status: DC
  Filled 2018-02-08: qty 1000

## 2018-02-08 MED ORDER — FENTANYL 2.5 MCG/ML W/ROPIVACAINE 0.15% IN NS 100 ML EPIDURAL (ARMC)
EPIDURAL | Status: DC | PRN
  Administered 2018-02-08: 12 mL/h via EPIDURAL

## 2018-02-08 MED ORDER — FENTANYL CITRATE (PF) 100 MCG/2ML IJ SOLN: 25.0000 ug | INTRAMUSCULAR | Status: DC | PRN

## 2018-02-08 MED ORDER — PHENYLEPHRINE 40 MCG/ML (10ML) SYRINGE FOR IV PUSH (FOR BLOOD PRESSURE SUPPORT): 80.0000 ug | PREFILLED_SYRINGE | INTRAVENOUS | Status: DC | PRN

## 2018-02-08 MED ORDER — BUPIVACAINE LIPOSOME 1.3 % IJ SUSP
INTRAMUSCULAR | Status: AC
  Filled 2018-02-08: qty 20

## 2018-02-08 MED ORDER — FENTANYL 2.5 MCG/ML W/ROPIVACAINE 0.15% IN NS 100 ML EPIDURAL (ARMC): 12.0000 mL/h | EPIDURAL | Status: DC

## 2018-02-08 MED ORDER — HYDROMORPHONE HCL 1 MG/ML IJ SOLN
1.0000 mg | Freq: Once | INTRAMUSCULAR | Status: AC
  Administered 2018-02-08: 1 mg via INTRAVENOUS
  Filled 2018-02-08: qty 1

## 2018-02-08 MED ORDER — DIPHENHYDRAMINE HCL 50 MG/ML IJ SOLN: 12.5000 mg | Freq: Four times a day (QID) | INTRAMUSCULAR | Status: DC | PRN

## 2018-02-08 MED ORDER — LACTATED RINGERS IV SOLN
500.0000 mL | INTRAVENOUS | Status: DC | PRN
  Administered 2018-02-08: 500 mL via INTRAVENOUS

## 2018-02-08 MED ORDER — ACETAMINOPHEN 325 MG PO TABS: 650.0000 mg | ORAL_TABLET | ORAL | Status: DC | PRN

## 2018-02-08 MED ORDER — LACTATED RINGERS IV SOLN
INTRAVENOUS | Status: DC
  Administered 2018-02-08 – 2018-02-09 (×2): via INTRAVENOUS

## 2018-02-08 MED ORDER — AMMONIA AROMATIC IN INHA
RESPIRATORY_TRACT | Status: AC
  Filled 2018-02-08: qty 10

## 2018-02-08 MED ORDER — SODIUM CHLORIDE 0.9% FLUSH: 9.0000 mL | INTRAVENOUS | Status: DC | PRN

## 2018-02-08 MED ORDER — SODIUM CHLORIDE 0.9 % IV SOLN
5.0000 10*6.[IU] | Freq: Once | INTRAVENOUS | Status: AC
  Administered 2018-02-08: 5 10*6.[IU] via INTRAVENOUS
  Filled 2018-02-08: qty 5

## 2018-02-08 MED ORDER — OXYCODONE-ACETAMINOPHEN 5-325 MG PO TABS
2.0000 | ORAL_TABLET | ORAL | Status: DC | PRN
  Administered 2018-02-09 – 2018-02-10 (×3): 2 via ORAL
  Filled 2018-02-08 (×3): qty 2

## 2018-02-08 MED ORDER — OXYTOCIN 40 UNITS IN LACTATED RINGERS INFUSION - SIMPLE MED
2.5000 [IU]/h | INTRAVENOUS | Status: DC
  Administered 2018-02-08: 2.5 [IU]/h via INTRAVENOUS
  Filled 2018-02-08: qty 1000

## 2018-02-08 MED ORDER — COCONUT OIL OIL
1.0000 "application " | TOPICAL_OIL | Status: DC | PRN
  Administered 2018-02-09: 1 via TOPICAL
  Filled 2018-02-08: qty 120

## 2018-02-08 MED ORDER — SIMETHICONE 80 MG PO CHEW: 80.0000 mg | CHEWABLE_TABLET | ORAL | Status: DC | PRN

## 2018-02-08 MED ORDER — TETANUS-DIPHTH-ACELL PERTUSSIS 5-2.5-18.5 LF-MCG/0.5 IM SUSP: 0.5000 mL | Freq: Once | INTRAMUSCULAR | Status: DC

## 2018-02-08 MED ORDER — LACTATED RINGERS IV SOLN
INTRAVENOUS | Status: DC
  Administered 2018-02-08 (×2): via INTRAVENOUS

## 2018-02-08 MED ORDER — FENTANYL CITRATE (PF) 100 MCG/2ML IJ SOLN: 50.0000 ug | INTRAMUSCULAR | Status: DC | PRN

## 2018-02-08 MED ORDER — TERBUTALINE SULFATE 1 MG/ML IJ SOLN: 0.2500 mg | Freq: Once | INTRAMUSCULAR | Status: DC | PRN

## 2018-02-08 MED ORDER — OXYCODONE-ACETAMINOPHEN 5-325 MG PO TABS
1.0000 | ORAL_TABLET | ORAL | Status: DC | PRN
  Administered 2018-02-09 – 2018-02-11 (×5): 1 via ORAL
  Filled 2018-02-08 (×5): qty 1

## 2018-02-08 MED ORDER — DIPHENHYDRAMINE HCL 50 MG/ML IJ SOLN: 12.5000 mg | INTRAMUSCULAR | Status: DC | PRN

## 2018-02-08 MED ORDER — BUPIVACAINE HCL (PF) 0.5 % IJ SOLN
INTRAMUSCULAR | Status: AC
  Filled 2018-02-08: qty 30

## 2018-02-08 MED ORDER — SIMETHICONE 80 MG PO CHEW
80.0000 mg | CHEWABLE_TABLET | Freq: Three times a day (TID) | ORAL | Status: DC
  Administered 2018-02-09 – 2018-02-11 (×8): 80 mg via ORAL
  Filled 2018-02-08 (×7): qty 1

## 2018-02-08 MED ORDER — SUCCINYLCHOLINE CHLORIDE 20 MG/ML IJ SOLN
INTRAMUSCULAR | Status: DC | PRN
  Administered 2018-02-08: 100 mg via INTRAVENOUS

## 2018-02-08 MED ORDER — DIPHENHYDRAMINE HCL 12.5 MG/5ML PO ELIX
12.5000 mg | ORAL_SOLUTION | Freq: Four times a day (QID) | ORAL | Status: DC | PRN
  Filled 2018-02-08: qty 5

## 2018-02-08 MED ORDER — KETOROLAC TROMETHAMINE 30 MG/ML IJ SOLN
30.0000 mg | Freq: Three times a day (TID) | INTRAMUSCULAR | Status: DC
  Administered 2018-02-08 – 2018-02-10 (×7): 30 mg via INTRAVENOUS
  Filled 2018-02-08 (×8): qty 1

## 2018-02-08 MED ORDER — PENICILLIN G 3 MILLION UNITS IVPB - SIMPLE MED
3.0000 10*6.[IU] | INTRAVENOUS | Status: DC
  Filled 2018-02-08 (×7): qty 100

## 2018-02-08 MED ORDER — DIPHENHYDRAMINE HCL 25 MG PO CAPS: 25.0000 mg | ORAL_CAPSULE | Freq: Four times a day (QID) | ORAL | Status: DC | PRN

## 2018-02-08 SURGICAL SUPPLY — 23 items
CANISTER SUCT 3000ML PPV (MISCELLANEOUS) ×3 IMPLANT
CHLORAPREP W/TINT 26ML (MISCELLANEOUS) ×6 IMPLANT
DERMABOND ADVANCED (GAUZE/BANDAGES/DRESSINGS) ×2
DERMABOND ADVANCED .7 DNX12 (GAUZE/BANDAGES/DRESSINGS) ×1 IMPLANT
DRSG OPSITE POSTOP 4X10 (GAUZE/BANDAGES/DRESSINGS) ×3 IMPLANT
ELECT CAUTERY BLADE 6.4 (BLADE) ×3 IMPLANT
ELECT REM PT RETURN 9FT ADLT (ELECTROSURGICAL) ×3
ELECTRODE REM PT RTRN 9FT ADLT (ELECTROSURGICAL) ×1 IMPLANT
GLOVE BIOGEL PI IND STRL 6.5 (GLOVE) ×1 IMPLANT
GLOVE BIOGEL PI INDICATOR 6.5 (GLOVE) ×2
GOWN STRL REUS W/ TWL LRG LVL3 (GOWN DISPOSABLE) ×1 IMPLANT
GOWN STRL REUS W/ TWL XL LVL3 (GOWN DISPOSABLE) ×2 IMPLANT
GOWN STRL REUS W/TWL LRG LVL3 (GOWN DISPOSABLE) ×2
GOWN STRL REUS W/TWL XL LVL3 (GOWN DISPOSABLE) ×4
NS IRRIG 1000ML POUR BTL (IV SOLUTION) ×3 IMPLANT
PACK C SECTION AR (MISCELLANEOUS) ×3 IMPLANT
PAD OB MATERNITY 4.3X12.25 (PERSONAL CARE ITEMS) ×3 IMPLANT
PAD PREP 24X41 OB/GYN DISP (PERSONAL CARE ITEMS) ×3 IMPLANT
SUT MNCRL AB 4-0 PS2 18 (SUTURE) ×3 IMPLANT
SUT PLAIN 3-0 (SUTURE) ×3 IMPLANT
SUT VIC AB 0 CT1 36 (SUTURE) ×9 IMPLANT
SUT VIC AB 2-0 CT1 36 (SUTURE) ×3 IMPLANT
SYR 30ML LL (SYRINGE) ×6 IMPLANT

## 2018-02-08 NOTE — H&P (Signed)
History and Physical Interval Note:  02/08/2018 9:30 AM  Yvonne Montoya  has presented today for INDUCTION OF LABOR,  with the diagnosis of Postdates. The various methods of treatment have been discussed with the patient. After consideration of risks, benefits and other options for treatment, the patient has consented to a labor induction .  The patient's history has been reviewed, patient examined, no change in status, and is stable for induction as planned.  See H&P on 02/03/2018. I have reviewed the patient's chart and labs.  Questions were answered to the patient's satisfaction.    Marcelyn Bruins, CNM [facility], [facility] 02/08/2018  9:53 AM

## 2018-02-08 NOTE — Progress Notes (Signed)
Subjective:  Patient resting in room. Has had some nausea. Her sister and her sister's significant other are at bedside.   Objective:  Vital Signs in the last 24 hours: Temp:  [97.2 F (36.2 C)-98.7 F (37.1 C)] 98 F (36.7 C) (10/07 2230) Pulse Rate:  [79-127] 112 (10/07 2230) Resp:  [13-27] 24 (10/07 2230) BP: (74-128)/(47-90) 128/81 (10/07 2230) SpO2:  [99 %-100 %] 100 % (10/07 2230) Weight:  [81.6 kg] 81.6 kg (10/07 0838)  Intake/Output from previous day: No intake/output data recorded. Intake/Output from this shift: Total I/O In: 300 [I.V.:300] Out: 650 [Urine:650]  Physical Exam: General appearance: alert, fatigued and mild distress Abdomen: soft, non-tender; bowel sounds normal; no masses,  no organomegaly  Lab Results: Recent Labs    02/08/18 0939  WBC 6.0  HGB 11.7*  PLT 266   No results for input(s): NA, K, CL, CO2, GLUCOSE, BUN, CREATININE in the last 72 hours. No results for input(s): TROPONINI in the last 72 hours.  Invalid input(s): CK, MB Hepatic Function Panel No results for input(s): PROT, ALBUMIN, AST, ALT, ALKPHOS, BILITOT, BILIDIR, IBILI in the last 72 hours. No results for input(s): CHOL in the last 72 hours. No results for input(s): PROTIME in the last 72 hours.  Cardiac Studies:  Assessment/Plan:   Continue regular post op care Will check post operative CBC at 1 am.  Will use Toradol and Dilaudid for pain control.   Discussion at bedside with her family members about the situation surrounding the  delivery. This discussion included the reason for her cesarean section (because of fetal bradycardia and suspected placental rupture), the difficult intubation, details of the procedure, her postoperative pain management, and expected postpartum course. Her sister would like to be called if there are any concerns or questions including need for a blood transfusion. I discussed with her sister that I did not obtain a written consent before the  procedure because of the emergent nature of the event. I explained that I did verbally consent her on the way to the OR. Her sister understood the urgent nature of the surgery and agreed that it was reasonable that the consent would be signed after the procedure.      LOS: 0 days    Yvonne Montoya 02/08/2018, 11:20 PM

## 2018-02-08 NOTE — Anesthesia Post-op Follow-up Note (Signed)
Anesthesia QCDR form completed.        

## 2018-02-08 NOTE — Progress Notes (Signed)
Patient ID: Yvonne Montoya, female   DOB: 01-28-1981, 37 y.o.   MRN: [id number]  Called to examine patient for vaginal bleeding. Moderate dark red blood . SVE 8/90/-3 Membranes palpated. AROM, clear fluid. Patient repositioned.  Category II tracing. Continue to monitor, expect vaginal delivery at this time. Will recheck in 1 hour.   Adelene Idler MD [facility], [facility] 02/08/18 6:19 PM

## 2018-02-08 NOTE — Anesthesia Procedure Notes (Signed)
Epidural Patient location during procedure: OB Start time: 02/08/2018 4:28 PM End time: 02/08/2018 4:34 PM  Staffing Anesthesiologist: Yves Dill, MD Resident/CRNA: Stormy Fabian, CRNA Performed: resident/CRNA   Preanesthetic Checklist Completed: patient identified, site marked, surgical consent, pre-op evaluation, timeout performed, IV checked, risks and benefits discussed and monitors and equipment checked  Epidural Patient position: sitting Prep: ChloraPrep Patient monitoring: heart rate, continuous pulse ox and blood pressure Approach: midline Location: L4-L5 Injection technique: LOR saline  Needle:  Needle type: Tuohy  Needle gauge: 17 G Needle length: 9 cm and 9 Needle insertion depth: 6 cm Catheter type: closed end flexible Catheter size: 19 Gauge Catheter at skin depth: 11 cm Test dose: negative and 1.5% lidocaine with Epi 1:200 K  Assessment Sensory level: T10 Events: blood not aspirated, injection not painful, no injection resistance, negative IV test and no paresthesia  Additional Notes 1 attempt Pt. Evaluated and documentation done after procedure finished. Patient identified. Risks/Benefits/Options discussed with patient including but not limited to bleeding, infection, nerve damage, paralysis, failed block, incomplete pain control, headache, blood pressure changes, nausea, vomiting, reactions to medication both or allergic, itching and postpartum back pain. Confirmed with bedside nurse the patient's most recent platelet count. Confirmed with patient that they are not currently taking any anticoagulation, have any bleeding history or any family history of bleeding disorders. Patient expressed understanding and wished to proceed. All questions were answered. Sterile technique was used throughout the entire procedure. Please see nursing notes for vital signs. Test dose was given through epidural catheter and negative prior to continuing to dose epidural or start  infusion. Warning signs of high block given to the patient including shortness of breath, tingling/numbness in hands, complete motor block, or any concerning symptoms with instructions to call for help. Patient was given instructions on fall risk and not to get out of bed. All questions and concerns addressed with instructions to call with any issues or inadequate analgesia.   Patient tolerated the insertion well without immediate complications.Reason for block:procedure for pain

## 2018-02-08 NOTE — Anesthesia Procedure Notes (Signed)
Procedure Name: Intubation Performed by: Yves Dill, MD Pre-anesthesia Checklist: Patient identified, Emergency Drugs available, Suction available, Timeout performed and Patient being monitored Patient Re-evaluated:Patient Re-evaluated prior to induction Oxygen Delivery Method: Circle system utilized Preoxygenation: Pre-oxygenation with 100% oxygen Induction Type: IV induction, Rapid sequence and Cricoid Pressure applied Ventilation: Mask ventilation without difficulty Laryngoscope Size: McGraph and Miller Grade View: Grade III Tube type: Oral Tube size: 7.0 mm Airway Equipment and Method: Rigid stylet,  Video-laryngoscopy,  Bougie stylet and Stylet Secured at: 21 cm Dental Injury: Teeth and Oropharynx as per pre-operative assessment  Difficulty Due To: Difficult Airway- due to anterior larynx and Difficult Airway- due to limited oral opening Future Recommendations: Recommend- induction with short-acting agent, and alternative techniques readily available

## 2018-02-08 NOTE — Anesthesia Preprocedure Evaluation (Addendum)
Anesthesia Evaluation  Patient identified by MRN, date of birth, ID band Patient awake    Reviewed: Allergy & Precautions, H&P , NPO status , Patient's Chart, lab work & pertinent test results  History of Anesthesia Complications Negative for: history of anesthetic complications  Airway Mallampati: III  TM Distance: <3 FB Neck ROM: full  Mouth opening: Limited Mouth Opening  Dental no notable dental hx.    Pulmonary neg pulmonary ROS,           Cardiovascular negative cardio ROS Normal cardiovascular exam     Neuro/Psych negative neurological ROS  negative psych ROS   GI/Hepatic negative GI ROS, Neg liver ROS,   Endo/Other  negative endocrine ROS  Renal/GU negative Renal ROS  negative genitourinary   Musculoskeletal   Abdominal   Peds  Hematology negative hematology ROS (+)   Anesthesia Other Findings   Reproductive/Obstetrics (+) Pregnancy                            Anesthesia Physical Anesthesia Plan  ASA: II  Anesthesia Plan: Epidural and General   Post-op Pain Management:    Induction:   PONV Risk Score and Plan:   Airway Management Planned:   Additional Equipment:   Intra-op Plan:   Post-operative Plan:   Informed Consent: I have reviewed the patients History and Physical, chart, labs and discussed the procedure including the risks, benefits and alternatives for the proposed anesthesia with the patient or authorized representative who has indicated his/her understanding and acceptance.     Plan Discussed with: Anesthesiologist  Anesthesia Plan Comments: (Stat C-S to GOT)       Anesthesia Quick Evaluation

## 2018-02-08 NOTE — OR Nursing (Signed)
Dr Jerene Pitch looked at xray and verified images and no retained objects were noted per dr Yevonne Pax.

## 2018-02-08 NOTE — Op Note (Signed)
Cesarean Section Procedure Note 02/08/18  Pre-operative Diagnosis: 1. Placental abruption 2. Fetal bradycardia, remote from delivery Post-operative Diagnosis: same, delivered. Procedure: Primary Low Transverse Cesarean Section Surgeon: Adelene Idler MD   Assistant(s): Ranae Plumber MD and Maryruth Eve CNM - No other skilled surgical assistant available. Anesthesia: General Estimated Blood Loss: 1590 cc Complications: Difficulty intubating patient  Disposition: PACU - hemodynamically stable. Condition: stable   Findings: A live viable female infant in the cephalic presentation. Amniotic fluid - clear  Birth weight: 8 lbs 15oz Apgars of 3 and 8.  Intact placenta with a three-vessel cord. Grossly normal uterus with evidence of bruising on the surface, normal fallopian tubes and ovaries bilaterally. No intraabdominal adhesions were noted.  Situation:  I was at the patient's bedside to recheck her vaginal bleeding and also because she was on and off of the monitor. Upon entering the room she was supine and nursing was attempting to insert a foley and was also trying to adjust the monitor for fetal heart tones. The heart tones were found. SVE was performed and she was 9/90/-3. The patient became nauseous and sat up to vomit while nursing attempted a foley insertion that was not successful. She passed a large dark red clot the size of an orange. There was a prolonged fetal heart rate deceleration. A fluid bolus was initiated. She was postioned to both her left and her right side. I called for a fetal scalp electrode but did not place it because the fetal heart tones were tracing clearly. At approximately 18:44 because there was not a response to the resuscitation, extended fetal bradycardia, and concern for possible placental abruption I called a STAT cesarean section and explained to the patient that she would need to have a cesarean section. I left the room to change into surgical attire as  nursing prepared the patient for cesarean section. When I returned to the room shortly after I helped escort the patient to the OR. On the way to the OR I verbally consented the patient for the procedure. Dr. Noralyn Pick from anesthesia was present in the OR when the patient entered the room. In the OR I assisted with moving the patient from the bed to the table. Heart tones were auscultated up until the time of prepping the abdomen. The fetal heart rate ranged from 120-88 bpm. I performed a SVE and there was still significant cervix completely around the fetal scalp. SVE 9/90/-3.  I placed the foley catheter. Nursing placed SCDs and the bovie pad. I scrubbed and prepped the patient. I applied the drape.  There was concern about the IV access, but it was verified that the IV was running. Anesthesia began their process of inducing general anesthesia and I waited with Dr. Elesa Massed, ready with a scalpel to perform the cesarean section. There was a delay in the induction of anesthesia because the patient's jaw was clenched and she could not be intubated.  Despite Dr. Randol Kern best efforts an airway could not be established. Dr. Elesa Massed moved to assist Dr. Noralyn Pick with the intubation. A code was called overhead and I asked to proceed with the cesarean section and Dr. Noralyn Pick agreed. I began the cesarean section. At some point after the initiation of the surgery and perhaps concurrently with the delivery of the infant the intubation was achieved.  Please see the procedure details for further information.    Exact times were not recorded in a complete manner due to the urgent nature of the event. My rough timeline  of events is as follows.  18:44 STAT cesarean section called.  18: 50 Patient transferred from the Room to the OR 18:57 When nursing stopped auscultating heart tones so that the patient's abdomen could be prepped 19:04 Code Blue paged overhead 19:05 or 19:06 Surgery started 19:07 Birth of infant  Procedure  Details :   The patient was taken to emergently to the operating room. Verbal consent for the procedure was obtained as she was rolled to the operating room. She was positioned into the dorsal supine position. A foley catheter was placed. SCDs were placed. A bovie pad was placed. She was prepped and draped in the usual sterile manner. There was a delay in intubation. Because of the delay I proceeded with Dr. Randol Kern approval with the emergent cesarean section.   A Pfannenstiel incision was made and carried down through the subcutaneous tissue to the fascia. Fascial incision was made and extended transversely with stretching. The peritoneal cavity was identified and entered bluntly and extended with stretching. The bladder blade was inserted and a low transverse hysterotomy was made. The fetus was cephalic, but with elevation of the fetal head the baby flipped to the breech position. The infant was then quickly delivered using the Mauriceau-Smellie-Veit manueuver.  The umbilical cord was clamped x2 and cut and the infant was handed to the awaiting pediatricians. The placenta was removed intact. The uterus was exteriorized and cleared of all clot and debris. The hysterotomy was closed with running sutures of 0-Vicryl suture. There were extensions of the uterine incision in the left and right corners that were repaired with 0-Vicryl. A second imbricating layer was placed with the same suture. Excellent hemostasis was observed. The ovaries and fallopian tubes were inspected and were normal. The abdomen was irrigated and cleared of clots and debris. The uterus was returned to the abdomen. The pelvis was irrigated and again, excellent hemostasis was noted. The rectus muscles were inspected and were hemostatic. The rectus fascia was then reapproximated with running sutures of 0-vicryl. An x-ray was taken to confirm that there were no instruments or sponges left in the abdomen.  Subcutaneous tissues are then irrigated  with saline and hemostasis assured. The subcutaneous fat was approximated with 3-0 plain and a running stitch.  Skin was then closed with 4-0 monocryl suture in a subcuticular fashion followed by skin adhesive. Exparel mixed with normal saline and lidocaine was injected in the subcutaneous tissue around the incision. No sponges or instruments were seen inside the abdomen on xray prior to the abdominal closure and at the conclusion of the case. The patient tolerated the procedure well and was transferred to the recovery room in stable condition.   Natale Milch MD [facility] 02/08/18 8:42 PM

## 2018-02-08 NOTE — Transfer of Care (Signed)
Immediate Anesthesia Transfer of Care Note  Patient: Yvonne Montoya  Procedure(s) Performed: CESAREAN SECTION (N/A Abdomen)  Patient Location: PACU  Anesthesia Type:General  Level of Consciousness: awake, alert  and oriented  Airway & Oxygen Therapy: Patient Spontanous Breathing  Post-op Assessment: Report given to RN  Post vital signs: Reviewed and stable  Last Vitals:  Vitals Value Taken Time  BP 74/66 02/08/2018  8:45 PM  Temp    Pulse 117 02/08/2018  8:47 PM  Resp 22 02/08/2018  8:47 PM  SpO2 100 % 02/08/2018  8:47 PM  Vitals shown include unvalidated device data.  Last Pain:  Vitals:   02/08/18 2035  TempSrc:   PainSc: (P) 0-No pain         Complications: No apparent anesthesia complications

## 2018-02-08 NOTE — OR Nursing (Signed)
Switched to Floor bed.  Epidural catheter removed from patient's back by L&D nurse.  Blue tip intact upon removal.

## 2018-02-08 NOTE — Progress Notes (Signed)
  Labor Progress Note   37 y.o. G4P3 @ [redacted]w[redacted]d , admitted for  Pregnancy, Labor Management. Induction of Labor, Postdates  Subjective:  Slightly more uncomfortable with contractions, but coping well.  Objective:  BP (!) 97/54 (BP Location: Right Arm)   Pulse 85   Temp 98.2 F (36.8 C) (Oral)   Resp 18   Ht 5\' 6"  (1.676 m)   Wt 81.6 kg   LMP 04/30/2017 (Exact Date)   BMI 29.05 kg/m  Abd: mild Extr: trace to 1+ bilateral pedal edema SVE: 1/30/-3, soft but posterior  EFM: FHR: 135 bpm, variability: moderate,  accelerations:  Present,  decelerations:  Absent Toco: Frequency: Every 3-5 minutes, Duration: 60-90 seconds and Intensity: mild to moderate Labs: I have reviewed the patient's lab results.   Assessment & Plan:  G4P3 @ [redacted]w[redacted]d, admitted for  Pregnancy and Labor/Delivery Management  1. Pain management: none. 2. FWB: FHT category I.  3. ID: GBS positive 4. Labor management: Second dose of Cytotec placed, consider Foley bulb at next check if favorable for insertion.  All discussed with patient.  Marcelyn Bruins, CNM 02/08/2018  3:23 PM

## 2018-02-08 NOTE — OR Nursing (Signed)
When this nurse took over no counts or time outs had been performed due to the emergent nature of the case. Xray was performed at the end of the case to verify no retained objects,Dr schuman verified this xray.

## 2018-02-09 ENCOUNTER — Encounter: Payer: Self-pay | Admitting: Obstetrics and Gynecology

## 2018-02-09 ENCOUNTER — Inpatient Hospital Stay: Payer: Self-pay

## 2018-02-09 LAB — CBC
HEMATOCRIT: 29.3 % — AB (ref 35.0–47.0)
HEMATOCRIT: 23.1 % — AB (ref 35.0–47.0)
HEMOGLOBIN: 9.9 g/dL — AB (ref 12.0–16.0)
HEMOGLOBIN: 7.8 g/dL — AB (ref 12.0–16.0)
MCH: 29.5 pg (ref 26.0–34.0)
MCH: 29.8 pg (ref 26.0–34.0)
MCHC: 33.6 g/dL (ref 32.0–36.0)
MCHC: 33.9 g/dL (ref 32.0–36.0)
MCV: 87.8 fL (ref 80.0–100.0)
MCV: 88 fL (ref 80.0–100.0)
Platelets: 241 10*3/uL (ref 150–440)
Platelets: 230 10*3/uL (ref 150–440)
RBC: 3.34 MIL/uL — ABNORMAL LOW (ref 3.80–5.20)
RBC: 2.63 MIL/uL — AB (ref 3.80–5.20)
RDW: 16.2 % — ABNORMAL HIGH (ref 11.5–14.5)
RDW: 16.4 % — ABNORMAL HIGH (ref 11.5–14.5)
WBC: 9.9 10*3/uL (ref 3.6–11.0)
WBC: 10.4 10*3/uL (ref 3.6–11.0)

## 2018-02-09 LAB — BASIC METABOLIC PANEL
Anion gap: 7 (ref 5–15)
BUN: <5 mg/dL — ABNORMAL LOW (ref 6–20)
CHLORIDE: 107 mmol/L (ref 98–111)
CO2: 18 mmol/L — ABNORMAL LOW (ref 22–32)
Calcium: 7.9 mg/dL — ABNORMAL LOW (ref 8.9–10.3)
Creatinine, Ser: 0.77 mg/dL (ref 0.44–1.00)
GFR calc Af Amer: >60 mL/min (ref >60)
GFR calc non Af Amer: >60 mL/min (ref >60)
GLUCOSE: 125 mg/dL — AB (ref 70–99)
POTASSIUM: 3.7 mmol/L (ref 3.5–5.1)
Sodium: 132 mmol/L — ABNORMAL LOW (ref 135–145)

## 2018-02-09 LAB — RPR: RPR Ser Ql: NONREACTIVE

## 2018-02-09 MED ORDER — SODIUM CHLORIDE 0.9 % IV BOLUS
500.0000 mL | Freq: Once | INTRAVENOUS | Status: AC
  Administered 2018-02-09: 500 mL via INTRAVENOUS

## 2018-02-09 MED ORDER — PHENOL 1.4 % MT LIQD
1.0000 | OROMUCOSAL | Status: DC | PRN
  Filled 2018-02-09: qty 177

## 2018-02-09 MED ORDER — FERROUS SULFATE 325 (65 FE) MG PO TABS
325.0000 mg | ORAL_TABLET | Freq: Two times a day (BID) | ORAL | Status: DC
  Administered 2018-02-09 – 2018-02-11 (×6): 325 mg via ORAL
  Filled 2018-02-09 (×6): qty 1

## 2018-02-09 MED ORDER — SODIUM CHLORIDE 0.9 % IV SOLN
1.5000 g | Freq: Four times a day (QID) | INTRAVENOUS | Status: DC
  Administered 2018-02-09 – 2018-02-10 (×4): 1.5 g via INTRAVENOUS
  Filled 2018-02-09 (×7): qty 1.5

## 2018-02-09 MED ORDER — SODIUM CHLORIDE 0.9 % IV SOLN
INTRAVENOUS | Status: DC | PRN
  Administered 2018-02-09 (×2): 10 mL via INTRAVENOUS

## 2018-02-09 MED ORDER — SODIUM CHLORIDE 0.9 % IV SOLN: INTRAVENOUS | Status: DC

## 2018-02-09 NOTE — Lactation Note (Addendum)
This note was copied from a baby's chart. Lactation Consultation Note  Patient Name: Boy Nupur Hohman UEAVW'[id number] Date: 02/09/2018 Reason for consult: Initial assessment   Maternal Data Has patient been taught Hand Expression?: Yes Does the patient have breastfeeding experience prior to this delivery?: Yes  Feeding Feeding Type: Breast Fed(attempted, Infant not interested) Nipple Type: Slow - flow  LATCH Score Latch: Too sleepy or reluctant, no latch achieved, no sucking elicited.  Audible Swallowing: None  Type of Nipple: Everted at rest and after stimulation  Comfort (Breast/Nipple): Soft / non-tender  Hold (Positioning): Assistance needed to correctly position infant at breast and maintain latch.  LATCH Score: 5  Interventions Interventions: Skin to skin;Hand express;Adjust position;Support pillows  Lactation Tools Discussed/Used     Consult Status Consult Status: Follow-up Date: 02/09/18 Follow-up type: In-patient  LC to room to assist with breastfeeding. Infant is in the crib and showing hunger cues. Infant was placed in the football position and fell asleep immediately. Sleeper was taken off baby and stimulated to wake baby and attempt in a different position. Infant was then placed in the cradle position for a second attempt and immediately fell asleep again. Several more attempts were made to initiate suck by placing a gloved finger inside infant's mouth. Infant did not suck on finger and seemed not interested. LC placed infant skin to skin with mother. LC explained that benefits of skin to skin and taught mother how to hand express which produced several drops of colostrum. Mother is concerned about being clean and the need to wash her breasts before breastfeeding. LC explained that washing breasts are not necessary and that infant learns mother's scent by the vernix. Mother verbalized understanding and will attempt breastfeeding again once infant is showing hunger  cues again while skin to skin.   LC returned at 10:40 and infant was able to latch to the right breast in the cradle position with a rhythmic suck. Mother denies pain while latching and breastfeeding.  Arlyss Gandy 02/09/2018, 10:13 AM

## 2018-02-09 NOTE — Progress Notes (Signed)
Patient has been doing well this shift.  VSS, HR and respirations have normalized.   Fundus firm with small bleeding. Incision clean/dry/intact, no drainage. IV Abx given as ordered. Incentive spirometer throughout the shift.  OOB this evening with minimal assistance to bathroom. Asymptomatic despite drop in Hgb 7.8- iron given BID today as ordered.  Pain under control with Toradol and Percocet.  Tolerating regular diet- good appetite.

## 2018-02-09 NOTE — Progress Notes (Addendum)
POD #1 CS for placental abruption/ fetal bradycardia under general anesthesia Subjective:  Complains of sore throat from intubation. Is whispering and using lozenges. Holding baby and trying to breast feed. Does not feel lightheaded when sitting up. Tolerating clear liquids and taking deep breathes with the incentive spirometer.  Objective:  Blood pressure 102/60, pulse 91, temperature 98.9 F (37.2 C), temperature source Oral, resp. rate 18, height _0  (1.676 m), weight 81.6 kg, last menstrual period 04/30/2017, SpO2 98 %, unknown if currently breastfeeding. Respiratory rate has slowly decreased to normal. Was tachycardic last night. Since 0800 pulse has been 100 and then 91 BPM  UO: 75-168 ml/hr since last night. Foley intact  General: sitting up holding baby. Appears sleepy Heart: RRR with grade II/VI systolic murmur at LSB Pulmonary: no increased work of breathing/ CTAB Abdomen: non-distended, soft, appropriately tender, fundus firm at level of umbilicus-1FB,  BS present Incision: honey comb dressing C+D+I Extremities: no edema, no erythema, no tenderness  Results for orders placed or performed during the hospital encounter of 02/08/18 (from the past 72 hour(s))  CBC     Status: Abnormal   Collection Time: 02/08/18  9:39 AM  Result Value Ref Range   WBC 6.0 3.6 - 11.0 K/uL   RBC 3.98 3.80 - 5.20 MIL/uL   Hemoglobin 11.7 (L) 12.0 - 16.0 g/dL   HCT 34.6 (L) 35.0 - 47.0 %   MCV 86.9 80.0 - 100.0 fL   MCH 29.5 26.0 - 34.0 pg   MCHC 33.9 32.0 - 36.0 g/dL   RDW 16.4 (H) 11.5 - 14.5 %   Platelets 266 150 - 440 K/uL    Comment: Performed at [facility], [address] [place], [place] [address]  Type and screen [place]     Status: None   Collection Time: 02/08/18  9:39 AM  Result Value Ref Range   ABO/RH(D) A POS    Antibody Screen NEG    Sample Expiration      02/11/2018 Performed at [facility], [address] [place],  [place] [address]   RPR     Status: None   Collection Time: 02/08/18  9:39 AM  Result Value Ref Range   RPR Ser Ql Non Reactive Non Reactive    Comment: (NOTE) Performed At: [facility] [place], [place] [id number] Rush Farmer MD [id number]   [place] rt PCR ([place] only)     Status: None   Collection Time: 02/08/18 11:51 AM  Result Value Ref Range   Specimen source GC/Chlam URINE, RANDOM    Chlamydia Tr NOT DETECTED NOT DETECTED   N gonorrhoeae NOT DETECTED NOT DETECTED    Comment: (NOTE) This CT/NG assay has not been evaluated in patients with a history of  hysterectomy. Performed at [facility], [address] [place], [place] [address]   CBC     Status: Abnormal   Collection Time: 02/09/18  1:04 AM  Result Value Ref Range   WBC 9.9 3.6 - 11.0 K/uL   RBC 3.34 (L) 3.80 - 5.20 MIL/uL   Hemoglobin 9.9 (L) 12.0 - 16.0 g/dL   HCT 29.3 (L) 35.0 - 47.0 %   MCV 87.8 80.0 - 100.0 fL   MCH 29.5 26.0 - 34.0 pg   MCHC 33.6 32.0 - 36.0 g/dL   RDW 16.2 (H) 11.5 - 14.5 %   Platelets 241 150 - 440 K/uL    Comment: Performed at [facility], [address] [place], [place] [address]  CBC     Status: Abnormal   Collection Time: 02/09/18  9:23 AM  Result Value Ref Range   WBC 10.4 3.6 - 11.0 K/uL   RBC 2.63 (L) 3.80 - 5.20 MIL/uL   Hemoglobin 7.8 (L) 12.0 - 16.0 g/dL    Comment: RESULT REPEATED AND VERIFIED   HCT 23.1 (L) 35.0 - 47.0 %   MCV 88.0 80.0 - 100.0 fL   MCH 29.8 26.0 - 34.0 pg   MCHC 33.9 32.0 - 36.0 g/dL   RDW 16.4 (H) 11.5 - 14.5 %   Platelets 230 150 - 440 K/uL    Comment: Performed at [facility], [address] [place], [place] [address]  Basic metabolic panel     Status: Abnormal   Collection Time: 02/09/18  9:23 AM  Result Value Ref Range   Sodium 132 (L) 135 - 145 mmol/L   Potassium 3.7 3.5 - 5.1 mmol/L   Chloride 107 98 - 111 mmol/L   CO2 18 (L) 22 - 32 mmol/L   Glucose, Bld 125 (H) 70 - 99 mg/dL    BUN <5 (L) 6 - 20 mg/dL   Creatinine, Ser 0.77 0.44 - 1.00 mg/dL   Calcium 7.9 (L) 8.9 - 10.3 mg/dL   GFR calc non Af Amer >60 >60 mL/min   GFR calc Af Amer >60 >60 mL/min    Comment: (NOTE) The eGFR has been calculated using the CKD EPI equation. This calculation has not been validated in all clinical situations. eGFR's persistently <60 mL/min signify possible Chronic Kidney Disease.    Anion gap 7 5 - 15    Comment: Performed at [facility], [address] [place], [place] [address]   Dg Chest Port 1 View  Result Date: 02/09/2018 CLINICAL DATA:  Tachycardia and tachypnea. Recent C-section. EXAM: PORTABLE CHEST 1 VIEW COMPARISON:  None. FINDINGS: Shallow inspiration. Heart size and pulmonary vascularity are normal. Patchy infiltration in the perihilar region of the right lung may reflect pneumonia. Left lung is clear. No blunting of costophrenic angles. No pneumothorax. Mediastinal contours appear intact. IMPRESSION: Patchy infiltration in the perihilar region of the right lung may reflect pneumonia. Hazy perihilar infiltration on the right may represent pneumonia. Electronically Signed   By: Lucienne Capers M.D.   On: 02/09/2018 04:52   Dg Abd Portable 1v  Addendum Date: 02/08/2018   ADDENDUM REPORT: 02/08/2018 21:07 ADDENDUM: After discussing the case with the referring physician, the linear radiopaque foreign body is likely an epidural catheter. Electronically Signed   By: Lowella Grip III M.D.   On: 02/08/2018 21:07   Result Date: 02/08/2018 CLINICAL DATA:  C -section performed with lack of sponge count EXAM: PORTABLE ABDOMEN - 1 VIEW COMPARISON:  None. FINDINGS: No evident sponge. A linear radiopaque foreign body is either in or overlying the right upper abdomen of uncertain etiology. There is distention of the stomach with air. There is no small or large bowel dilatation. No free air appreciable on this supine examination. IMPRESSION: No evidence sponge. A linear string  like opacity is seen either in or overlying the right upper abdomen of uncertain etiology. Moderate air noted in stomach. No bowel obstruction or free air noted on this supine examination. Electronically Signed: By: Lowella Grip III M.D. On: 02/08/2018 20:35    Assessment:   37 y.o. [id number] postoperativeday # 1 s/p emergency Cesarean section Possible aspiration pneumonia-has already been started on Unasyn Sore throat due to difficult  intubation-cepacol lozenges, Chloraseptic spray   Plan:  1) Acute blood loss anemia - hemodynamically stable and asymptomatic thus far - po ferrous sulfate/ vitamins -if not lightheaded when OOB, can discontinue foley Consider blood transfusion if symptomatic CBC in AM  2) A POS  3) TDAP -has had tetanus toxoid during pregnancy  4) Breast/Contraception?  Dalia Heading, CNM

## 2018-02-09 NOTE — Anesthesia Postprocedure Evaluation (Signed)
Anesthesia Post Note  Patient: Yvonne Montoya  Procedure(s) Performed: CESAREAN SECTION (N/A Abdomen)  Patient location during evaluation: PACU Anesthesia Type: Epidural and General Level of consciousness: awake and alert and oriented Pain management: pain level controlled Vital Signs Assessment: post-procedure vital signs reviewed and stable Respiratory status: spontaneous breathing Cardiovascular status: blood pressure returned to baseline Comments: Patient underwent emergency C-S for suspected abruption with periods of FHR decelerations.  Upon induction with propofol and anectine, the patient exhibited marked trismus with difficulty to effectively open the mouth, but effective ventilations to keep the sats high. The OB proceeded with the case and the intubation was finally secured with some relaxation noted in the jaw .  The CO2 levels did not rise, and the body temp did not rise.  The urine color which had been blood tinged prior to induction began to clear at the end of the case into the PACU.     Last Vitals:  Vitals:   02/09/18 1100 02/09/18 1602  BP: 102/60 99/60  Pulse: 91 90  Resp: 18 18  Temp: 37.2 C 37 C  SpO2: 98%     Last Pain:  Vitals:   02/09/18 1602  TempSrc: Oral  PainSc:                  Dymond Spreen

## 2018-02-09 NOTE — Progress Notes (Signed)
Subjective:  Patient seen at bedside. She has no complaints. She is alert and answering questions.   Objective:  Vital Signs in the last 24 hours: Temp:  [97.2 F (36.2 C)-98.9 F (37.2 C)] 98.8 F (37.1 C) (10/08 0120) Pulse Rate:  [79-132] 118 (10/08 0120) Resp:  [13-27] 22 (10/08 0120) BP: (74-134)/(47-90) 134/80 (10/08 0120) SpO2:  [98 %-100 %] 98 % (10/08 0120) Weight:  [81.6 kg] 81.6 kg (10/07 0838)  Intake/Output from previous day: 10/07 0701 - 10/08 0700 In: 300 [I.V.:300] Out: 1125 [Urine:1100; Blood:25] Intake/Output from this shift: Total I/O In: 300 [I.V.:300] Out: 1100 [Urine:1100]  Physical Exam: General appearance: alert and cooperative Lungs: clear to auscultation bilaterally Heart: S1, S2 normal and tachycardic Abdomen: soft tender, non-distended, no rebound, mild tenderness on examination. Uterine fundus firm.  Extremities: extremities normal, atraumatic, no cyanosis or edema Pulses: 2+ and symmetric Skin: Skin color, texture, turgor normal. No rashes or lesions or no evidence of bleeding or bruising and no evidence of DVT  Lab Results: Recent Labs    02/08/18 0939 02/09/18 0104  WBC 6.0 9.9  HGB 11.7* 9.9*  PLT 266 241   No results for input(s): NA, K, CL, CO2, GLUCOSE, BUN, CREATININE in the last 72 hours. No results for input(s): TROPONINI in the last 72 hours.  Invalid input(s): CK, MB Hepatic Function Panel No results for input(s): PROT, ALBUMIN, AST, ALT, ALKPHOS, BILITOT, BILIDIR, IBILI in the last 72 hours. No results for input(s): CHOL in the last 72 hours. No results for input(s): PROTIME in the last 72 hours.   Cardiac Studies: EKG  Assessment/Plan:  37 yo s/p LTCS POD#1 1.  Tachycardic and tachypnea, hgb was 9.9 at 1 am. She is oxygenating well on room air. EKG showed sinus tachycardia. Will obtain chest xray. Will bolus with 1L of fluid. Will obtain CBC and BMP at 9am.     LOS: 1 day    Georgianne Gritz R Safwan Tomei 02/09/2018, 3:57  AM

## 2018-02-09 NOTE — Progress Notes (Signed)
Patient wheeled into SCN in bed to visit baby. VSS, IVF infusing as ordered, foley catheter in place for strict I/O. Patient instructed to ask SCN staff to call RN when she is ready to come back or needs anything.

## 2018-02-10 ENCOUNTER — Encounter: Payer: Self-pay | Admitting: Certified Nurse Midwife

## 2018-02-10 LAB — CBC
HEMATOCRIT: 20.9 % — AB (ref 36.0–46.0)
HEMOGLOBIN: 7 g/dL — AB (ref 12.0–15.0)
MCH: 29.9 pg (ref 26.0–34.0)
MCHC: 33.5 g/dL (ref 30.0–36.0)
MCV: 89.3 fL (ref 80.0–100.0)
NRBC: 0 % (ref 0.0–0.2)
Platelets: 227 10*3/uL (ref 150–400)
RBC: 2.34 MIL/uL — ABNORMAL LOW (ref 3.87–5.11)
RDW: 15.8 % — AB (ref 11.5–15.5)
WBC: 10.6 10*3/uL — ABNORMAL HIGH (ref 4.0–10.5)

## 2018-02-10 LAB — VARICELLA ZOSTER ANTIBODY, IGG: Varicella IgG: 500 index (ref >165)

## 2018-02-10 LAB — MEASLES/MUMPS/RUBELLA IMMUNITY
Mumps IgG: >300 AU/mL (ref >10.9)
Rubella: 14.6 index (ref >0.99)

## 2018-02-10 LAB — HIV ANTIBODY (ROUTINE TESTING W REFLEX): HIV SCREEN 4TH GENERATION: NONREACTIVE

## 2018-02-10 MED ORDER — AMOXICILLIN-POT CLAVULANATE 875-125 MG PO TABS
1.0000 | ORAL_TABLET | Freq: Two times a day (BID) | ORAL | Status: DC
  Administered 2018-02-10 – 2018-02-11 (×3): 1 via ORAL
  Filled 2018-02-10 (×4): qty 1

## 2018-02-10 NOTE — Lactation Note (Signed)
This note was copied from a baby's chart. Lactation Consultation Note  Patient Name: Yvonne Montoya BJYNW'G Date: 02/10/2018     Maternal Data    Feeding Feeding Type: Breast Fed  LATCH Score                   Interventions    Lactation Tools Discussed/Used     Consult Status  LC spoke with mother to check the progress of breastfeeding. Mother has been independent at this point and has not needed assistance. Mother states that she feels infant is not getting enough milk. LC asked about voids and dirty diapers and mother states that infant has had 4 wet and 4 dirty diapers today. LC reviewed the signs of how she can tell if infant has had enough to eat the breast, hunger cues and frequency of wet and dirty diapers. Mother verbalized understanding.    Arlyss Gandy 02/10/2018, 4:30 PM

## 2018-02-10 NOTE — Progress Notes (Signed)
POD #2 CS for placental abruption/ fetal bradycardia under general anesthesia Subjective:  Continues to have sore throat from intubation. Is whispering and using lozenges and Chloraseptic spray. Feels cold-but afebrile. Does not feel lightheaded when OOB. Took shower this AM.  Tolerating regular diet and passing flatus Voiding without difficulty Objective:  Vital signs: BP 110/83 (BP Location: Right Arm)   Pulse 93   Temp 98.3 F (36.8 C) (Oral)   Resp 18   Ht _0  (1.676 m)   Wt 81.6 kg   LMP 04/30/2017 (Exact Date)   SpO2 100%   Breastfeeding? Unknown   BMI 29.05 kg/m    General: appears comfortable, in NAD Heart: RRR with grade II/VI systolic murmur at LSB Pulmonary: no increased work of breathing/ CTAB Abdomen: non-distended, soft, appropriately tender,  BS present Incision: honey comb dressing C+D+I Extremities: trace edema, no erythema, no tenderness  Results for orders placed or performed during the hospital encounter of 02/08/18 (from the past 72 hour(s))  CBC     Status: Abnormal   Collection Time: 02/08/18  9:39 AM  Result Value Ref Range   WBC 6.0 3.6 - 11.0 K/uL   RBC 3.98 3.80 - 5.20 MIL/uL   Hemoglobin 11.7 (L) 12.0 - 16.0 g/dL   HCT 34.6 (L) 35.0 - 47.0 %   MCV 86.9 80.0 - 100.0 fL   MCH 29.5 26.0 - 34.0 pg   MCHC 33.9 32.0 - 36.0 g/dL   RDW 16.4 (H) 11.5 - 14.5 %   Platelets 266 150 - 440 K/uL    Comment: Performed at [facility], [address] [place], [place] [address]  Type and screen [place]     Status: None   Collection Time: 02/08/18  9:39 AM  Result Value Ref Range   ABO/RH(D) A POS    Antibody Screen NEG    Sample Expiration      02/11/2018 Performed at [facility], [address] [place], [place] [address]   RPR     Status: None   Collection Time: 02/08/18  9:39 AM  Result Value Ref Range   RPR Ser Ql Non Reactive Non Reactive    Comment: (NOTE) Performed At: [facility] [place], [place] [id number] Rush Farmer MD QG:[id number]   [place] rt PCR ([place] only)     Status: None   Collection Time: 02/08/18 11:51 AM  Result Value Ref Range   Specimen source GC/Chlam URINE, RANDOM    Chlamydia Tr NOT DETECTED NOT DETECTED   N gonorrhoeae NOT DETECTED NOT DETECTED    Comment: (NOTE) This CT/NG assay has not been evaluated in patients with a history of  hysterectomy. Performed at [facility], [address] [place], [place] [address]   Measles/Mumps/Rubella Immunity     Status: None   Collection Time: 02/09/18  1:04 AM  Result Value Ref Range   Rubella 14.60 Immune >0.99 index    Comment: (NOTE)                                Non-immune       <0.90                                Equivocal  0.90 - 0.99  Immune           >0.99    Rubeola IgG >300.0 Immune >16.4 AU/mL    Comment: (NOTE)                                 Negative        <13.5                                 Equivocal 13.5 - 16.4                                 Positive        >16.4 Presence of antibodies to Rubeola is presumptive evidence of immunity except when acute infection is suspected.    Mumps IgG >300.0 Immune >10.9 AU/mL    Comment: (NOTE)                                Negative         <9.0                                Equivocal  9.0 - 10.9                                Positive        >10.9 A positive result generally indicates past exposure to Mumps virus or previous vaccination. Performed At: [facility] [place], [place] [id number] Rush Farmer MD WU:[id number]   Varicella zoster antibody, IgG     Status: None   Collection Time: 02/09/18  1:04 AM  Result Value Ref Range   Varicella IgG 500 Immune >165 index    Comment: (NOTE)                               Negative          <135                               Equivocal    135 - 165                               Positive           >165 A positive result generally indicates exposure to the pathogen or administration of specific immunoglobulins, but it is not indication of active infection or stage of disease. Performed At: [facility] [place], [place] [id number] Rush Farmer MD KV:[id number]   HIV Antibody (routine testing w rflx)     Status: None   Collection Time: 02/09/18  1:04 AM  Result Value Ref Range   HIV Screen 4th Generation wRfx Non Reactive Non Reactive    Comment: (NOTE) Performed At: [facility] [place], [place] [id number] Rush Farmer MD AC:[id number]   CBC     Status: Abnormal   Collection Time: 02/09/18  1:04 AM  Result Value Ref Range  WBC 9.9 3.6 - 11.0 K/uL   RBC 3.34 (L) 3.80 - 5.20 MIL/uL   Hemoglobin 9.9 (L) 12.0 - 16.0 g/dL   HCT 29.3 (L) 35.0 - 47.0 %   MCV 87.8 80.0 - 100.0 fL   MCH 29.5 26.0 - 34.0 pg   MCHC 33.6 32.0 - 36.0 g/dL   RDW 16.2 (H) 11.5 - 14.5 %   Platelets 241 150 - 440 K/uL    Comment: Performed at [facility], [address] [place], [place] [address]  CBC     Status: Abnormal   Collection Time: 02/09/18  9:23 AM  Result Value Ref Range   WBC 10.4 3.6 - 11.0 K/uL   RBC 2.63 (L) 3.80 - 5.20 MIL/uL   Hemoglobin 7.8 (L) 12.0 - 16.0 g/dL    Comment: RESULT REPEATED AND VERIFIED   HCT 23.1 (L) 35.0 - 47.0 %   MCV 88.0 80.0 - 100.0 fL   MCH 29.8 26.0 - 34.0 pg   MCHC 33.9 32.0 - 36.0 g/dL   RDW 16.4 (H) 11.5 - 14.5 %   Platelets 230 150 - 440 K/uL    Comment: Performed at [facility], [address] [place], [place] [address]  Basic metabolic panel     Status: Abnormal   Collection Time: 02/09/18  9:23 AM  Result Value Ref Range   Sodium 132 (L) 135 - 145 mmol/L   Potassium 3.7 3.5 - 5.1 mmol/L   Chloride 107 98 - 111 mmol/L   CO2 18 (L) 22 - 32 mmol/L   Glucose, Bld 125 (H) 70 - 99 mg/dL   BUN <5 (L) 6 - 20 mg/dL   Creatinine, Ser 0.77 0.44 - 1.00 mg/dL   Calcium 7.9 (L) 8.9 -  10.3 mg/dL   GFR calc non Af Amer >60 >60 mL/min   GFR calc Af Amer >60 >60 mL/min    Comment: (NOTE) The eGFR has been calculated using the CKD EPI equation. This calculation has not been validated in all clinical situations. eGFR's persistently <60 mL/min signify possible Chronic Kidney Disease.    Anion gap 7 5 - 15    Comment: Performed at [facility], [address] [place], [place] [address]  CBC     Status: Abnormal   Collection Time: 02/10/18  5:28 AM  Result Value Ref Range   WBC 10.6 (H) 4.0 - 10.5 K/uL   RBC 2.34 (L) 3.87 - 5.11 MIL/uL   Hemoglobin 7.0 (L) 12.0 - 15.0 g/dL   HCT 20.9 (L) 36.0 - 46.0 %   MCV 89.3 80.0 - 100.0 fL   MCH 29.9 26.0 - 34.0 pg   MCHC 33.5 30.0 - 36.0 g/dL   RDW 15.8 (H) 11.5 - 15.5 %   Platelets 227 150 - 400 K/uL   nRBC 0.0 0.0 - 0.2 %    Comment: Performed at [facility], [address] [place], [place] [address]   No results found.  Assessment:   37 y.o. [id number] postoperativeday # 2 s/p emergency Cesarean section Possible aspiration pneumonia- on Unasyn has been afebrile  Switch to Augmentin Sore throat due to difficult intubation-cepacol lozenges, Chloraseptic spray   Plan:  1) Acute blood loss anemia - hemodynamically stable and asymptomatic thus far - po ferrous sulfate/ vitamins Consider blood transfusion if symptomatic CBC in AM  2) A POS  3) TDAP -has had tetanus toxoid during pregnancy  4) Breast/Contraception-interval tubal  5) Possible discharge in Am  [place]  Danise Mina, CNM

## 2018-02-11 LAB — CBC
HCT: 24.4 % — ABNORMAL LOW (ref 36.0–46.0)
HEMATOCRIT: 20.2 % — AB (ref 36.0–46.0)
HEMOGLOBIN: 6.7 g/dL — AB (ref 12.0–15.0)
Hemoglobin: 8 g/dL — ABNORMAL LOW (ref 12.0–15.0)
MCH: 29.8 pg (ref 26.0–34.0)
MCH: 29.6 pg (ref 26.0–34.0)
MCHC: 33.2 g/dL (ref 30.0–36.0)
MCHC: 32.8 g/dL (ref 30.0–36.0)
MCV: 89.8 fL (ref 80.0–100.0)
MCV: 90.4 fL (ref 80.0–100.0)
NRBC: 0 % (ref 0.0–0.2)
PLATELETS: 272 10*3/uL (ref 150–400)
Platelets: 243 10*3/uL (ref 150–400)
RBC: 2.25 MIL/uL — ABNORMAL LOW (ref 3.87–5.11)
RBC: 2.7 MIL/uL — ABNORMAL LOW (ref 3.87–5.11)
RDW: 15.7 % — ABNORMAL HIGH (ref 11.5–15.5)
RDW: 15 % (ref 11.5–15.5)
WBC: 8.7 10*3/uL (ref 4.0–10.5)
WBC: 8.4 10*3/uL (ref 4.0–10.5)
nRBC: 0 % (ref 0.0–0.2)

## 2018-02-11 LAB — SURGICAL PATHOLOGY

## 2018-02-11 LAB — PREPARE RBC (CROSSMATCH)

## 2018-02-11 LAB — ABO/RH: ABO/RH(D): A POS

## 2018-02-11 MED ORDER — OXYCODONE-ACETAMINOPHEN 5-325 MG PO TABS: 2.0000 | ORAL_TABLET | ORAL | 0 refills | Status: AC | PRN

## 2018-02-11 MED ORDER — FERRALET 90 90-1 MG PO TABS: 1.0000 | ORAL_TABLET | Freq: Every day | ORAL | 2 refills | Status: AC

## 2018-02-11 MED ORDER — SODIUM CHLORIDE 0.9% IV SOLUTION
Freq: Once | INTRAVENOUS | Status: AC
  Administered 2018-02-11: 11:00:00 via INTRAVENOUS

## 2018-02-11 MED ORDER — PHENYLEPHRINE HCL 10 MG/ML IJ SOLN
INTRAMUSCULAR | Status: DC | PRN
  Administered 2018-02-08 (×2): 100 ug via INTRAVENOUS

## 2018-02-11 MED ORDER — IBUPROFEN 600 MG PO TABS
600.0000 mg | ORAL_TABLET | Freq: Four times a day (QID) | ORAL | Status: DC | PRN
  Administered 2018-02-11 (×2): 600 mg via ORAL
  Filled 2018-02-11 (×2): qty 1

## 2018-02-11 NOTE — Progress Notes (Signed)
Pt discharged with infant. Discharge instructions, prescriptions, and follow up appointments given to and reviewed with patient. Pt verbalized understanding. Escorted out by Psychologist, sport and exercise. Otilio Jefferson, RN

## 2018-02-11 NOTE — Discharge Summary (Signed)
OB Discharge Summary     Patient Name: Yvonne Montoya DOB: 10-06-1980 MRN: [id number]  Date of admission: 02/08/2018 Delivering MD: Letitia Libra, MD  Date of Delivery: 02/08/2018  Date of discharge: 02/11/2018  Admitting diagnosis: 40 WKS PREG Intrauterine pregnancy: [redacted]w[redacted]d     Secondary diagnosis: Abruption, Delivery by Cesarean     Discharge diagnosis: Term Pregnancy Delivered and Abruption, Reasons for cesarean section  Non-reassuring FHR and Other  Abruption                         Hospital course:  Onset of Labor With Unplanned C/S  37 y.o. yo [id number] at [redacted]w[redacted]d was admitted in Latent Labor on 02/08/2018. Patient had a labor course significant for bleeding and decels on monitoring. Membrane Rupture Time/Date: 5:26 PM ,02/08/2018   The patient went for cesarean section due to Non-Reassuring FHR and bleeding from abruption, and delivered a Viable infant,02/08/2018  Details of operation can be found in separate operative note. Patient had an uncomplicated postpartum course.  She is ambulating,tolerating a regular diet, passing flatus, and urinating well.  Patient is discharged home in stable condition 02/11/18.                                                                 Post partum procedures:blood transfusion  Complications: Placental Abruption  Physical exam on 02/11/2018: Vitals:   02/11/18 1046 02/11/18 1117 02/11/18 1347 02/11/18 1557  BP: 110/76 106/69 113/78 114/77  Pulse: 87 88 89 86  Resp: 17 18 20 18   Temp: 98 F (36.7 C) 98.6 F (37 C) 98 F (36.7 C) 98 F (36.7 C)  TempSrc: Axillary Oral Oral Oral  SpO2: 100% 99% 100%   Weight:      Height:       General: alert, cooperative and no distress Lochia: appropriate Uterine Fundus: firm Incision: Healing well with no significant drainage, No significant erythema DVT Evaluation: No evidence of DVT seen on physical exam. Negative Homan's sign.  Labs: Lab Results  Component Value Date   WBC 8.4 02/11/2018   HGB 8.0 (L) 02/11/2018   HCT 24.4 (L) 02/11/2018   MCV 90.4 02/11/2018   PLT 272 02/11/2018   CMP Latest Ref Rng & Units 02/09/2018  Glucose 70 - 99 mg/dL 147(W)  BUN 6 - 20 mg/dL <2(N)  Creatinine 5.62 - 1.00 mg/dL 1.30  Sodium 865 - 784 mmol/L 132(L)  Potassium 3.5 - 5.1 mmol/L 3.7  Chloride 98 - 111 mmol/L 107  CO2 22 - 32 mmol/L 18(L)  Calcium 8.9 - 10.3 mg/dL 7.9(L)    Discharge instruction: per After Visit Summary.  Medications:  Allergies as of 02/11/2018   No Known Allergies     Medication List    TAKE these medications   aspirin 81 MG chewable tablet Chew by mouth daily.   FERRALET 90 90-1 MG Tabs Take 1 tablet by mouth daily.   oxyCODONE-acetaminophen 5-325 MG tablet Commonly known as:  PERCOCET/ROXICET Take 2 tablets by mouth every 4 (four) hours as needed (pain scale > 7).   PROVIDA DHA 16-16-1.25-110 MG Caps Take 1 capsule by mouth daily.       Diet: routine diet  Activity: Advance as tolerated. Pelvic rest for 6 weeks.  Outpatient follow up: Follow-up Information    Schuman, Jaquelyn Bitter, MD. Go in 1 week(s).   Specialty:  Obstetrics and Gynecology Why:  Oct 17 at 1130am Contact information: [address] [place] [address] [phone]             Postpartum contraception: desires tubal at a later time Rhogam Given postpartum: no Rubella vaccine given postpartum: no Varicella vaccine given postpartum: no TDaP given antepartum or postpartum: Yes  Newborn Data: Live born female  Birth Weight: 8 lb 15.2 oz (4060 g) APGAR: 3, 8  Newborn Delivery   Birth date/time:  02/08/2018 19:07:00 Delivery type:  C-Section, Low Transverse Trial of labor:  Yes C-section categorization:  Primary      Baby Feeding: Breast  Disposition:home with mother  SIGNED: Letitia Libra, MD 02/11/2018 5:39 PM

## 2018-02-11 NOTE — Lactation Note (Signed)
This note was copied from a baby's chart. Lactation Consultation Note  Patient Name: Yvonne Montoya Date: 02/11/2018 Reason for consult: Follow-up assessment   Maternal Data    Feeding Feeding Type: Bottle Fed - Formula  LATCH Score                   Interventions Interventions: Breast feeding basics reviewed;Hand express;DEBP;Coconut oil  Lactation Tools Discussed/Used Pump Review: Setup, frequency, and cleaning Initiated by:: Arlyss Gandy, RN IBCLC Date initiated:: 02/11/18   Consult Status Consult Status: Follow-up Date: 02/11/18 Follow-up type: In-patient  LC to room to check status of breastfeeding. Nurse reported that Mother's hemoglobin was low and she needs a blood transfusion. Mother is concerned that she does not have enough milk. LC reviewed hand expression with mother and provided mother with a pump to initiate colostrum production. She pumped for 15 minutes and produced about 5mL of colostrum.    Arlyss Gandy 02/11/2018, 10:13 AM

## 2018-02-11 NOTE — Progress Notes (Signed)
Dr. Tiburcio Pea notified Hgb 8.0 after 1 unit pRBC transfusion today. Dr. Tiburcio Pea to place discharge orders for today.

## 2018-02-11 NOTE — Progress Notes (Signed)
Blood consent signed and patient educated.

## 2018-02-11 NOTE — Progress Notes (Signed)
POD #3 CS for placental abruption/ fetal bradycardia under general anesthesia Subjective:  Feeling well today. She is sitting on side of bed holding baby. She states breastfeeding is going well. She is ambulating and voiding without difficulty. She has had a bowel movement. She is tolerating PO intake and her pain is well controlled with PO medication. She admits feeling tired because of lack of sleep and denies any lightheadedness, dizziness, shortness of breath. Her throat is feeling better. Discussed risks/benefits of recommended blood transfusion due to decreased hemoglobin/hematocrit. She is agreeable to transfusion and is hoping to discharge to home later today.  Objective:  Vital signs: BP 109/79 (BP Location: Left Arm)   Pulse 87   Temp 98.4 F (36.9 C) (Oral)   Resp 18   Ht '5\' 6"'$  (1.676 m)   Wt 81.6 kg   LMP 04/30/2017 (Exact Date)   SpO2 100%   Breastfeeding   BMI 29.05 kg/m    General: appears comfortable, in NAD Heart: RRR with grade II/VI systolic murmur at LSB Pulmonary: no increased work of breathing/ CTAB Abdomen: non-distended, soft, appropriately tender,  BS present Incision: honey comb dressing C+D+I Extremities: trace edema, no erythema, no tenderness  Results for orders placed or performed during the hospital encounter of 02/08/18 (from the past 72 hour(s))  CBC     Status: Abnormal   Collection Time: 02/08/18  9:39 AM  Result Value Ref Range   WBC 6.0 3.6 - 11.0 K/uL   RBC 3.98 3.80 - 5.20 MIL/uL   Hemoglobin 11.7 (L) 12.0 - 16.0 g/dL   HCT 34.6 (L) 35.0 - 47.0 %   MCV 86.9 80.0 - 100.0 fL   MCH 29.5 26.0 - 34.0 pg   MCHC 33.9 32.0 - 36.0 g/dL   RDW 16.4 (H) 11.5 - 14.5 %   Platelets 266 150 - 440 K/uL    Comment: Performed at [facility], [address] [place], [place] [address]  Type and screen [place]     Status: None   Collection Time: 02/08/18  9:39 AM  Result Value Ref Range   ABO/RH(D) A POS    Antibody  Screen NEG    Sample Expiration      02/11/2018 Performed at [facility], [address] [place], [place] [address]   RPR     Status: None   Collection Time: 02/08/18  9:39 AM  Result Value Ref Range   RPR Ser Ql Non Reactive Non Reactive    Comment: (NOTE) Performed At: [facility] [place], [place] [id number] Rush Farmer MD MV:[id number]   [place] rt PCR ([place] only)     Status: None   Collection Time: 02/08/18 11:51 AM  Result Value Ref Range   Specimen source GC/Chlam URINE, RANDOM    Chlamydia Tr NOT DETECTED NOT DETECTED   N gonorrhoeae NOT DETECTED NOT DETECTED    Comment: (NOTE) This CT/NG assay has not been evaluated in patients with a history of  hysterectomy. Performed at [facility], [address] [place], [place] [address]   Measles/Mumps/Rubella Immunity     Status: None   Collection Time: 02/09/18  1:04 AM  Result Value Ref Range   Rubella 14.60 Immune >0.99 index    Comment: (NOTE)                                Non-immune       <  0.90                                Equivocal  0.90 - 0.99                                Immune           >0.99    Rubeola IgG >300.0 Immune >16.4 AU/mL    Comment: (NOTE)                                 Negative        <13.5                                 Equivocal 13.5 - 16.4                                 Positive        >16.4 Presence of antibodies to Rubeola is presumptive evidence of immunity except when acute infection is suspected.    Mumps IgG >300.0 Immune >10.9 AU/mL    Comment: (NOTE)                                Negative         <9.0                                Equivocal  9.0 - 10.9                                Positive        >10.9 A positive result generally indicates past exposure to Mumps virus or previous vaccination. Performed At: [facility] [place], [place] [id number] Rush Farmer MD OX:[id number]   Varicella  zoster antibody, IgG     Status: None   Collection Time: 02/09/18  1:04 AM  Result Value Ref Range   Varicella IgG 500 Immune >165 index    Comment: (NOTE)                               Negative          <135                               Equivocal    135 - 165                               Positive          >165 A positive result generally indicates exposure to the pathogen or administration of specific immunoglobulins, but it is not indication of active infection or stage of disease. Performed At: [facility] [place], [place] [id number] Rush Farmer MD [id number]   HIV Antibody (routine testing w rflx)     Status: None  Collection Time: 02/09/18  1:04 AM  Result Value Ref Range   HIV Screen 4th Generation wRfx Non Reactive Non Reactive    Comment: (NOTE) Performed At: [facility] [place], [place] [id number] Rush Farmer MD UR:[id number]   CBC     Status: Abnormal   Collection Time: 02/09/18  1:04 AM  Result Value Ref Range   WBC 9.9 3.6 - 11.0 K/uL   RBC 3.34 (L) 3.80 - 5.20 MIL/uL   Hemoglobin 9.9 (L) 12.0 - 16.0 g/dL   HCT 29.3 (L) 35.0 - 47.0 %   MCV 87.8 80.0 - 100.0 fL   MCH 29.5 26.0 - 34.0 pg   MCHC 33.6 32.0 - 36.0 g/dL   RDW 16.2 (H) 11.5 - 14.5 %   Platelets 241 150 - 440 K/uL    Comment: Performed at [facility], [address] [place], [place] [address]  CBC     Status: Abnormal   Collection Time: 02/09/18  9:23 AM  Result Value Ref Range   WBC 10.4 3.6 - 11.0 K/uL   RBC 2.63 (L) 3.80 - 5.20 MIL/uL   Hemoglobin 7.8 (L) 12.0 - 16.0 g/dL    Comment: RESULT REPEATED AND VERIFIED   HCT 23.1 (L) 35.0 - 47.0 %   MCV 88.0 80.0 - 100.0 fL   MCH 29.8 26.0 - 34.0 pg   MCHC 33.9 32.0 - 36.0 g/dL   RDW 16.4 (H) 11.5 - 14.5 %   Platelets 230 150 - 440 K/uL    Comment: Performed at [facility], [address] [place], [place] [address]  Basic metabolic panel     Status: Abnormal    Collection Time: 02/09/18  9:23 AM  Result Value Ref Range   Sodium 132 (L) 135 - 145 mmol/L   Potassium 3.7 3.5 - 5.1 mmol/L   Chloride 107 98 - 111 mmol/L   CO2 18 (L) 22 - 32 mmol/L   Glucose, Bld 125 (H) 70 - 99 mg/dL   BUN <5 (L) 6 - 20 mg/dL   Creatinine, Ser 0.77 0.44 - 1.00 mg/dL   Calcium 7.9 (L) 8.9 - 10.3 mg/dL   GFR calc non Af Amer >60 >60 mL/min   GFR calc Af Amer >60 >60 mL/min    Comment: (NOTE) The eGFR has been calculated using the CKD EPI equation. This calculation has not been validated in all clinical situations. eGFR's persistently <60 mL/min signify possible Chronic Kidney Disease.    Anion gap 7 5 - 15    Comment: Performed at [facility], [address] [place], [place] [address]  CBC     Status: Abnormal   Collection Time: 02/10/18  5:28 AM  Result Value Ref Range   WBC 10.6 (H) 4.0 - 10.5 K/uL   RBC 2.34 (L) 3.87 - 5.11 MIL/uL   Hemoglobin 7.0 (L) 12.0 - 15.0 g/dL   HCT 20.9 (L) 36.0 - 46.0 %   MCV 89.3 80.0 - 100.0 fL   MCH 29.9 26.0 - 34.0 pg   MCHC 33.5 30.0 - 36.0 g/dL   RDW 15.8 (H) 11.5 - 15.5 %   Platelets 227 150 - 400 K/uL   nRBC 0.0 0.0 - 0.2 %    Comment: Performed at [facility], [address] [place], [place] [address]  CBC     Status: Abnormal   Collection Time: 02/11/18  5:39 AM  Result Value Ref Range   WBC 8.7 4.0 - 10.5  K/uL   RBC 2.25 (L) 3.87 - 5.11 MIL/uL   Hemoglobin 6.7 (L) 12.0 - 15.0 g/dL   HCT 20.2 (L) 36.0 - 46.0 %   MCV 89.8 80.0 - 100.0 fL   MCH 29.8 26.0 - 34.0 pg   MCHC 33.2 30.0 - 36.0 g/dL   RDW 15.7 (H) 11.5 - 15.5 %   Platelets 243 150 - 400 K/uL   nRBC 0.0 0.0 - 0.2 %    Comment: Performed at [facility], [address] [place], [place] [address]   No results found.  Assessment:   37 y.o. [id number] postoperativeday # 3 s/p emergency Cesarean section Possible aspiration pneumonia- on Augmentin Sore throat- improving- due to difficult intubation-cepacol lozenges,  Chloraseptic spray   Plan:  1) Acute blood loss anemia - hemodynamically stable and asymptomatic thus far - po ferrous sulfate/ vitamins  2) A POS  3) TDAP -has had tetanus toxoid during pregnancy  4) Breast/Contraception-interval tubal  5) Possible discharge later today following blood transfusion/CBC  Rod Can, CNM

## 2018-02-12 LAB — TYPE AND SCREEN
ABO/RH(D): A POS
Antibody Screen: NEGATIVE
UNIT DIVISION: 0

## 2018-02-12 LAB — BPAM RBC
Blood Product Expiration Date: 201911052359
ISSUE DATE / TIME: 201910101055
UNIT TYPE AND RH: 6200

## 2018-02-18 ENCOUNTER — Encounter: Payer: Self-pay | Admitting: Obstetrics and Gynecology

## 2018-02-18 ENCOUNTER — Ambulatory Visit (INDEPENDENT_AMBULATORY_CARE_PROVIDER_SITE_OTHER): Payer: Self-pay | Admitting: Obstetrics and Gynecology

## 2018-02-18 VITALS — BP 140/90 | HR 77 | Ht 66.0 in | Wt 169.0 lb

## 2018-02-18 DIAGNOSIS — Z98891 History of uterine scar from previous surgery: Secondary | ICD-10-CM

## 2018-02-18 DIAGNOSIS — O165 Unspecified maternal hypertension, complicating the puerperium: Secondary | ICD-10-CM

## 2018-02-18 DIAGNOSIS — R030 Elevated blood-pressure reading, without diagnosis of hypertension: Secondary | ICD-10-CM

## 2018-02-18 LAB — POCT URINALYSIS DIPSTICK OB: GLUCOSE, UA: NEGATIVE

## 2018-02-18 MED ORDER — LABETALOL HCL 200 MG PO TABS: 200.0000 mg | ORAL_TABLET | Freq: Three times a day (TID) | ORAL | 1 refills | Status: AC

## 2018-02-18 NOTE — Progress Notes (Signed)
  OBSTETRICS POSTPARTUM CLINIC PROGRESS NOTE  Subjective:     Yvonne Montoya is a 37 y.o. G11P4004 female who presents for a postpartum visit. She is 1 week postpartum following a Term pregnancy and delivery by C-section emergent.  I have fully reviewed the prenatal and intrapartum course. Anesthesia: epidural and general.  Postpartum course has been complicated by complicated by hypertension and placental abruption, postpartum hemorrhage and anemia.[name] is feeding by Breast.  Bleeding: patient has not  resumed menses.  Bowel function is normal. Bladder function is normal.  Patient is not sexually active. Contraception method desired is IUD.  Postpartum depression screening: negative. Left sided headache. Reports fevers on and off but has not taken her temperature. Pitting edema 3+ of bilateral feet.  She reports that she has h  The following portions of the patient's history were reviewed and updated as appropriate: allergies, current medications, past family history, past medical history, past social history, past surgical history and problem list.  Review of Systems Pertinent items are noted in HPI.  Objective:    BP 140/90   Pulse 77   Ht 5\' 6"  (1.676 m)   Wt 169 lb (76.7 kg)   Breastfeeding? Yes   BMI 27.28 kg/m   General:  alert and no distress   Breasts:  inspection negative, no nipple discharge or bleeding, no masses or nodularity palpable  Lungs: clear to auscultation bilaterally  Heart:  regular rate and rhythm, S1, S2 normal, no murmur, click, rub or gallop  Abdomen: soft, non-tender; bowel sounds normal; no masses,  no organomegaly.   Well healed Pfannenstiel incision   Vulva:  normal  Vagina: normal vagina, no discharge, exudate, lesion, or erythema  Cervix:  no cervical motion tenderness and no lesions  Corpus: normal size, contour, position, consistency, mobility, non-tender  Adnexa:  normal adnexa and no mass, fullness, tenderness  Rectal Exam: Not performed.           Assessment:  Post Partum Care visit 1. Elevated blood pressure reading  Will start on labetalol 200mg  TID. Her sister checks her blood pressure at home and is a Engineer, civil (consulting). Her highest value at home was 150/90/  She reports that she has had elevated blood pressure before postpartum.   - CBC - Comprehensive metabolic panel - Protein / creatinine ratio, urine Lab work not done because she is self pay and declines at this time. Asked to go to the ER or call office for RUQ pain, blurry vision, vision changes, severe headache, and elevated blood pressures of systolic >160 or diastolic >110. She will return  Monday for a blood pressure check  Encourage iron supplementation 2 times a day for anemia. She reports cold intolerance.   Plan:  See orders and Patient Instructions Follow up in: 4 days or as needed.   Adelene Idler MD [facility], [facility] 02/18/18 11:50 AM

## 2018-02-22 ENCOUNTER — Encounter: Payer: Self-pay | Admitting: Obstetrics and Gynecology

## 2018-02-22 ENCOUNTER — Ambulatory Visit (INDEPENDENT_AMBULATORY_CARE_PROVIDER_SITE_OTHER): Payer: Self-pay | Admitting: Obstetrics and Gynecology

## 2018-02-22 VITALS — BP 118/70 | HR 78 | Ht 66.0 in | Wt 160.0 lb

## 2018-02-22 DIAGNOSIS — R030 Elevated blood-pressure reading, without diagnosis of hypertension: Secondary | ICD-10-CM

## 2018-02-22 DIAGNOSIS — Z98891 History of uterine scar from previous surgery: Secondary | ICD-10-CM

## 2018-02-22 NOTE — Progress Notes (Signed)
  OBSTETRICS POSTPARTUM CLINIC PROGRESS NOTE  Subjective:     Yvonne Montoya is a 37 y.o. G46P4004 female who presents for a postpartum visit. She is 2 weeks postpartum following a Term pregnancy and delivery by C-section emergent.  I have fully reviewed the prenatal and intrapartum course. Anesthesia: epidural and general.  Postpartum course has been complicated by complicated by hypertension.  Baby is feeding by Breast.  Bleeding: patient has not  resumed menses.  Bowel function is normal. Bladder function is normal.  Patient is not sexually active. Contraception method desired is OCP (estrogen/progesterone).  Postpartum depression screening: negative.   Her swelling has significantly improved. No headaches.  She reports continued "chills" She is also having pain on both sides which she attributes to ovarian pain, she has this outside of pregnancy as well and relates it to PCOS.   The following portions of the patient's history were reviewed and updated as appropriate: allergies, current medications, past family history, past medical history, past social history, past surgical history and problem list.  Review of Systems Pertinent items are noted in HPI.  Objective:    BP 118/70   Pulse 78   Ht 5\' 6"  (1.676 m)   Wt 160 lb (72.6 kg)   Breastfeeding? Yes   BMI 25.82 kg/m   General:  alert and no distress   Breasts:  inspection negative, no nipple discharge or bleeding, no masses or nodularity palpable  Lungs: clear to auscultation bilaterally  Heart:  regular rate and rhythm, S1, S2 normal, no murmur, click, rub or gallop  Abdomen: soft, non-tender; bowel sounds normal; no masses,  no organomegaly.   Well healed Pfannenstiel incision   Vulva:  normal  Vagina: normal vagina, no discharge, exudate, lesion, or erythema  Cervix:  no cervical motion tenderness and no lesions  Corpus: normal size, contour, position, consistency, mobility, non-tender  Adnexa:  normal adnexa and no  mass, fullness, tenderness  Rectal Exam: Not performed.          Assessment:  Post Partum Care visit  Plan:  See orders and Patient Instructions Will continue labetalol therapy. Instructed to stop BP medicine if she becomes faint or lightheaded. Will track BP at home and bring a log of the BP values to her next visit.  Follow up in: 1 week or as needed.   Adelene Idler MD [facility], [facility] 02/22/18 12:54 PM

## 2018-03-01 ENCOUNTER — Ambulatory Visit: Payer: Self-pay | Admitting: Obstetrics and Gynecology

## 2018-03-02 ENCOUNTER — Encounter: Payer: Self-pay | Admitting: Obstetrics and Gynecology

## 2018-03-02 ENCOUNTER — Ambulatory Visit (INDEPENDENT_AMBULATORY_CARE_PROVIDER_SITE_OTHER): Payer: Self-pay | Admitting: Obstetrics and Gynecology

## 2018-03-02 VITALS — BP 130/78 | HR 80 | Ht 63.0 in | Wt 158.5 lb

## 2018-03-02 DIAGNOSIS — Z98891 History of uterine scar from previous surgery: Secondary | ICD-10-CM

## 2018-03-02 DIAGNOSIS — O9122 Nonpurulent mastitis associated with the puerperium: Secondary | ICD-10-CM

## 2018-03-02 DIAGNOSIS — O165 Unspecified maternal hypertension, complicating the puerperium: Secondary | ICD-10-CM

## 2018-03-02 DIAGNOSIS — R030 Elevated blood-pressure reading, without diagnosis of hypertension: Secondary | ICD-10-CM

## 2018-03-02 MED ORDER — DICLOXACILLIN SODIUM 500 MG PO CAPS: 500.0000 mg | ORAL_CAPSULE | Freq: Four times a day (QID) | ORAL | 0 refills | Status: AC

## 2018-03-02 NOTE — Progress Notes (Signed)
Patient ID: Yvonne Montoya, female   DOB: 1980/10/19, 37 y.o.   MRN: [id number]  Reason for Consult: Postpartum Care (Left breast, redish, lumps, and sore to touch and warm to the touch )   Referred by [facility],*  Subjective:     HPI:  Yvonne Montoya is a 37 y.o. female . She presents for postpartum follow up. She reports that starting on Saturday of last week she has tenderness and pain in her right breast. She has been feeling hot and cold since the delivery, but she does not have a thermometer and has not taken her temperature. She has noticed that her infant is not feeding much from her right breast and prefers her other breast. She has noticed tenderness and pain in the right breast as well as redness of the skin. She is not using a pump or hand expressing to empty the breast.  She told me she missed several days of taking her blood pressure while she was in [place]. She has taken the labetalol today and yesterday.  She denies headache, denies vision changes. Edema has improved.  I also discussed with the patient and her sister(on the phone)  plans for birthcontrol. Her sister would like her to have a tubal ligation, but the patient would prefer an IUD. I discussed IUD options with the patient and her sister and gave them pamphlets about both of these options.   History reviewed. No pertinent past medical history. History reviewed. No pertinent family history. Past Surgical History:  Procedure Laterality Date  . CESAREAN SECTION N/A 02/08/2018   Procedure: CESAREAN SECTION;  Surgeon: Natale Milch, MD;  Location: [facility] ORS;  Service: Obstetrics;  Laterality: N/A;    Short Social History:  Social History   Tobacco Use  . Smoking status: Never Smoker  . Smokeless tobacco: Never Used  Substance Use Topics  . Alcohol use: Yes    Comment: Occ    No Known Allergies  Current Outpatient Medications  Medication Sig Dispense Refill  . Fe Cbn-Fe  Gluc-FA-B12-C-DSS (FERRALET 90) 90-1 MG TABS Take 1 tablet by mouth daily. 30 each 2  . labetalol (NORMODYNE) 200 MG tablet Take 1 tablet (200 mg total) by mouth 3 (three) times daily. 60 tablet 1  . aspirin 81 MG chewable tablet Chew by mouth daily.    Marland Kitchen oxyCODONE-acetaminophen (PERCOCET/ROXICET) 5-325 MG tablet Take 2 tablets by mouth every 4 (four) hours as needed (pain scale > 7). (Patient not taking: Reported on 02/18/2018) 30 tablet 0  . Prenat-FeFum-FePo-FA-DHA w/o A (PROVIDA DHA) 16-16-1.25-110 MG CAPS Take 1 capsule by mouth daily. (Patient not taking: Reported on 02/22/2018) 30 capsule 11   No current facility-administered medications for this visit.     Review of Systems  Constitutional: Positive for chills and fever. Negative for fatigue and unexpected weight change.  HENT: Negative for trouble swallowing.  Eyes: Negative for loss of vision.  Respiratory: Negative for cough, shortness of breath and wheezing.  Cardiovascular: Negative for chest pain, leg swelling, palpitations and syncope.       Breast pain GI: Positive for abdominal pain. Negative for blood in stool, diarrhea, nausea and vomiting.  GU: Negative for difficulty urinating, dysuria, frequency and hematuria.  Musculoskeletal: Negative for back pain, leg pain and joint pain.  Skin: Negative for rash.  Neurological: Negative for dizziness, headaches, light-headedness, numbness and seizures.  Psychiatric: Negative for behavioral problem, confusion, depressed mood and sleep disturbance.        Objective:  Objective   Vitals:   03/02/18 1050  BP: 130/78  Pulse: 80  Weight: 158 lb 8 oz (71.9 kg)  Height: 5\' 3"  (1.6 m)   Body mass index is 28.08 kg/m.  Physical Exam  Constitutional: She is oriented to person, place, and time. She appears well-developed and well-nourished.  HENT:  Head: Normocephalic and atraumatic.  Eyes: Pupils are equal, round, and reactive to light. EOM are normal.  Cardiovascular:  Normal rate and regular rhythm.  Pulmonary/Chest: Effort normal. No respiratory distress.    Abdominal: Soft. She exhibits no distension and no mass. There is tenderness. There is no rebound and no guarding. No hernia.    Neurological: She is alert and oriented to person, place, and time.  Skin: Skin is warm and dry.  Psychiatric: She has a normal mood and affect. Her behavior is normal. Judgment and thought content normal.  Nursing note and vitals reviewed.      Assessment/Plan:     37 yo  Mastitis- start oral antibiotic, supportive care reviewed.   I spoke with the patient and her sister on the phone about how to treat her mastitis. I recommended monitoring her temperature twice a day and returning for a fever >100.4. I discussed hot and cold compresses before and after breast feeding/pumping respectively to aid in emptying the breast of milk.  I discussed how to self express milk from the breast with the patient. I discussed antibiotic usage. Will obtain a breast ultrasound to assess for a breast abscess which may need to be drained. Close follow up planned for Friday.    Postpartum hypertension- continue labetalol 200mg  TID.  IUD planned for birthcontrol. She will decide between Mirena and a copper IUD. Given patient information for both. Reviewed differences with the patient.    There is continued urgency to the patient's prenatal care as she is [place].   Follow up Friday.    Adelene Idler MD [facility], [facility] 03/02/18 11:16 AM

## 2018-03-02 NOTE — Patient Instructions (Signed)
Breastfeeding and Mastitis  Mastitis is inflammation of the breast tissue. It can occur in women who are breastfeeding. This can make breastfeeding painful. Mastitis will sometimes go away on its own, especially if it is not caused by an infection (non-infectious mastitis). Your health care provider will help determine if medical treatment is needed. Treatment may be needed if the condition is caused by a bacterial infection (infectious mastitis).  What are the causes?  This condition is often associated with a blocked milkduct, which can happen when too much milk builds up in the breast. Causes of excess milk in the breast can include:  · Poor latch-on. If your baby is not latched onto the breast properly, he or she may not empty your breast completely while breastfeeding.  · Allowing too much time to pass between feedings.  · Wearing a bra or other clothing that is too tight. This puts extra pressure on the milk ducts so milk does not flow through them as it should.  · Milk remaining in the breast because it is overfilled (engorged).  · Stress and fatigue.    Mastitis can also be caused by a bacterial infection. Bacteria may enter the breast tissue through cuts, cracks, or openings in the skin near the nipple area. Cracks in the skin are often caused when your baby does not latch on properly to the breast.  What are the signs or symptoms?  Symptoms of this condition include:  · Swelling, redness, tenderness, and pain in an area of the breast. This usually affects the upper part of the breast, toward the armpit region. In most cases, it affects only one breast. In some cases, it may occur on both breasts at the same time and affect a larger portion of breast tissue.  · Swelling of the glands under the arm on the same side.  · Fatigue, headache, and flu-like muscle aches.  · Fever.  · Rapid pulse.    Symptoms usually last 2 to 5 days. Breast pain and redness are at their worst on day 2 and day 3, and they usually go  away by day 5. If an infection is left to progress, a collection of pus (abscess) may develop.  How is this diagnosed?  This condition can be diagnosed based on your symptoms and a physical exam. You may also have tests, such as:  · Blood tests to determine if your body is fighting a bacterial infection.  · Mammogram or ultrasound tests to rule out other problems or diseases.  · Fluid tests. If an abscess has developed, the fluid in the abscess may be removed with a needle. The fluid may be analyzed to determine if bacteria are present.  · Breast milk may be cultured and tested for bacteria.    How is this treated?  This condition will sometimes go away on its own. Your health care provider may choose to wait 24 hours after first seeing you to decide whether treatment is needed. If treatment is needed, it may include:  · Strategies to manage breastfeeding. This includes continuing to breastfeed or pump in order to allow adequate milk flow, using breast massage, and applying heat or cold to the affected area.  · Self-care such as rest and increased fluid intake.  · Medicine for pain.  · Antibiotic medicine to treat a bacterial infection. This is usually taken by mouth.  · If an abscess has developed, it may be treated by removing fluid with a needle.      care provider. Do not stop taking the antibiotic even if you start to feel better. General instructions  Do not wear a tight or underwire bra. Wear a soft, supportive bra.  Increase your fluid intake, especially if you have a fever.  Get plenty of rest. For breastfeeding:  Continue to empty your breasts as often as possible, either by breastfeeding or using an electric breast pump. This will lower the pressure and the pain that comes with  it. Ask your health care provider if changes need to be made to your breastfeeding or pumping routine.  Keep your nipples clean and dry.  During breastfeeding, empty the first breast completely before going to the other breast. If your baby is not emptying your breasts completely, use a breast pump to empty your breasts.  Use breast massage during feeding or pumping sessions.  If directed, apply moist heat to the affected area of your breast right before breastfeeding or pumping. Use the heat source that your health care provider recommends.  If directed, put ice on the affected area of your breast right after breastfeeding or pumping: ? Put ice in a plastic bag. ? Place a towel between your skin and the bag. ? Leave the ice on for 20 minutes.  If you go back to work, pump your breasts while at work to stay in time with your nursing schedule.  Do not allow your breasts to become engorged. Contact a health care provider if:  You have pus-like discharge from the breast.  You have a fever.  Your symptoms do not improve within 2 days of starting treatment.  Your symptoms return after you have recovered from a breast infection. Get help right away if:  Your pain and swelling are getting worse.  You have pain that is not controlled with medicine.  You have a red line extending from the breast toward your armpit. Summary  Mastitis is inflammation of the breast tissue. It is often caused by a blocked milk duct or bacteria.  This condition may be treated with hot and cold compresses, medicines, self-care, and certain breastfeeding strategies.  If you were prescribed an antibiotic medicine, take it as told by your health care provider. Do not stop taking the antibiotic even if you start to feel better.  Continue to empty your breasts as often as possible either by breastfeeding or using an electric breast pump. This information is not intended to replace advice given to you by your  health care provider. Make sure you discuss any questions you have with your health care provider. Document Released: 08/16/2004 Document Revised: 04/22/2016 Document Reviewed: 04/22/2016 Elsevier Interactive Patient Education  2017 [employer] Mastitis Mastitis is redness, soreness, and puffiness (inflammation) in an area of the breast. It is often caused by an infection that occurs when bacteria enter the skin. The infection is often helped by antibiotic medicine. Follow these instructions at home:  Only take medicines as told by your doctor.  If your doctor prescribed an antibiotic medicine, take it as told. Finish it even if you start to feel better.  Do not wear a tight or underwire bra. Wear a soft support bra.  Drink more fluids, especially if you have a fever.  If you are breastfeeding: ? Keep emptying the breast. Your doctor can tell you if the milk is safe. Use a breast pump if you are told to stop nursing. ? Keep your nipples clean and dry. ? Empty the first breast before going to the other  breast. Use a breast pump if your baby is not emptying your breast. ? If you go back to work, pump your breasts while at work. ? Avoid letting your breasts get overly filled with milk (engorged). Contact a doctor if:  You have pus-like fluid leaking from your breast.  Your symptoms do not get better within 2 days. Get help right away if:  Your pain and puffiness are getting worse.  Your pain is not helped by medicine.  You have a red line going from your breast toward your armpit.  You have a fever or lasting symptoms for more than 2-3 days.  You have a fever and your symptoms suddenly get worse. This information is not intended to replace advice given to you by your health care provider. Make sure you discuss any questions you have with your health care provider. Document Released: 04/09/2009 Document Revised: 09/27/2015 Document Reviewed: 11/19/2012 Elsevier Interactive  Patient Education  2017 [employer].

## 2018-03-03 ENCOUNTER — Other Ambulatory Visit: Payer: Self-pay | Admitting: Obstetrics and Gynecology

## 2018-03-03 DIAGNOSIS — O9122 Nonpurulent mastitis associated with the puerperium: Secondary | ICD-10-CM

## 2018-03-05 ENCOUNTER — Encounter: Payer: Self-pay | Admitting: Obstetrics & Gynecology

## 2018-03-05 ENCOUNTER — Ambulatory Visit (INDEPENDENT_AMBULATORY_CARE_PROVIDER_SITE_OTHER): Payer: Self-pay | Admitting: Obstetrics & Gynecology

## 2018-03-05 VITALS — BP 120/80 | Ht 60.6 in | Wt 159.0 lb

## 2018-03-05 DIAGNOSIS — N61 Mastitis without abscess: Secondary | ICD-10-CM

## 2018-03-05 NOTE — Progress Notes (Signed)
  History of Present Illness:  Yvonne Montoya is a 37 y.o. who was started on  Current Meds  Medication Sig  . dicloxacillin (DYNAPEN) 500 MG capsule Take 1 capsule (500 mg total) by mouth 4 (four) times daily for 10 days.   approximately 3 days ago. Since that time, she states that Yvonne Montoya symptoms are improving.  PMHx: She  has no past medical history on file. Also,  has a past surgical history that includes Cesarean section (N/A, 02/08/2018)., family history is not on file.,  reports that she has never smoked. She has never used smokeless tobacco. She reports that she drinks alcohol. She reports that she does not use drugs. Current Meds  Medication Sig  . dicloxacillin (DYNAPEN) 500 MG capsule Take 1 capsule (500 mg total) by mouth 4 (four) times daily for 10 days.  Marland Kitchen labetalol (NORMODYNE) 200 MG tablet Take 1 tablet (200 mg total) by mouth 3 (three) times daily.  . Prenat-FeFum-FePo-FA-DHA w/o A (PROVIDA DHA) 16-16-1.25-110 MG CAPS Take 1 capsule by mouth daily.  . Also, has No Known Allergies..  Review of Systems  All other systems reviewed and are negative.   Physical Exam:  BP 120/80   Ht 5' 0.6" (1.539 m)   Wt 159 lb (72.1 kg)   BMI 30.44 kg/m  Body mass index is 30.44 kg/m. Constitutional: Well nourished, well developed female in no acute distress.  Abdomen: diffusely non tender to palpation, non distended, and no masses, hernias Neuro: Grossly intact Psych:  Normal mood and affect.    Assessment:   Mastitis    -  Primary, Resolving    Medication treatment is going very well for Yvonne Montoya masstitis.  Plan: She will undergo completion of ABX in Yvonne Montoya medical therapy.  She was amenable to this plan and we will see Yvonne Montoya back for IUD placement soon.  She plans to travel back to home country on Nov 7.  A total of 15 minutes were spent face-to-face with the patient during this encounter and over half of that time dealt with counseling and coordination of care.  Annamarie Major, MD,  [facility], [facility] 03/05/2018  9:13 AM

## 2018-03-08 ENCOUNTER — Ambulatory Visit: Payer: Self-pay | Admitting: Obstetrics & Gynecology

## 2018-03-15 ENCOUNTER — Other Ambulatory Visit: Payer: Self-pay

## 2020-04-03 IMAGING — DX DG CHEST 1V PORT
1 series · 1 of 1 positions shown · non-contrast
Comparison: None.

CLINICAL DATA: Tachycardia and tachypnea. Recent C-section.

EXAM:
PORTABLE CHEST 1 VIEW

[chest ap]
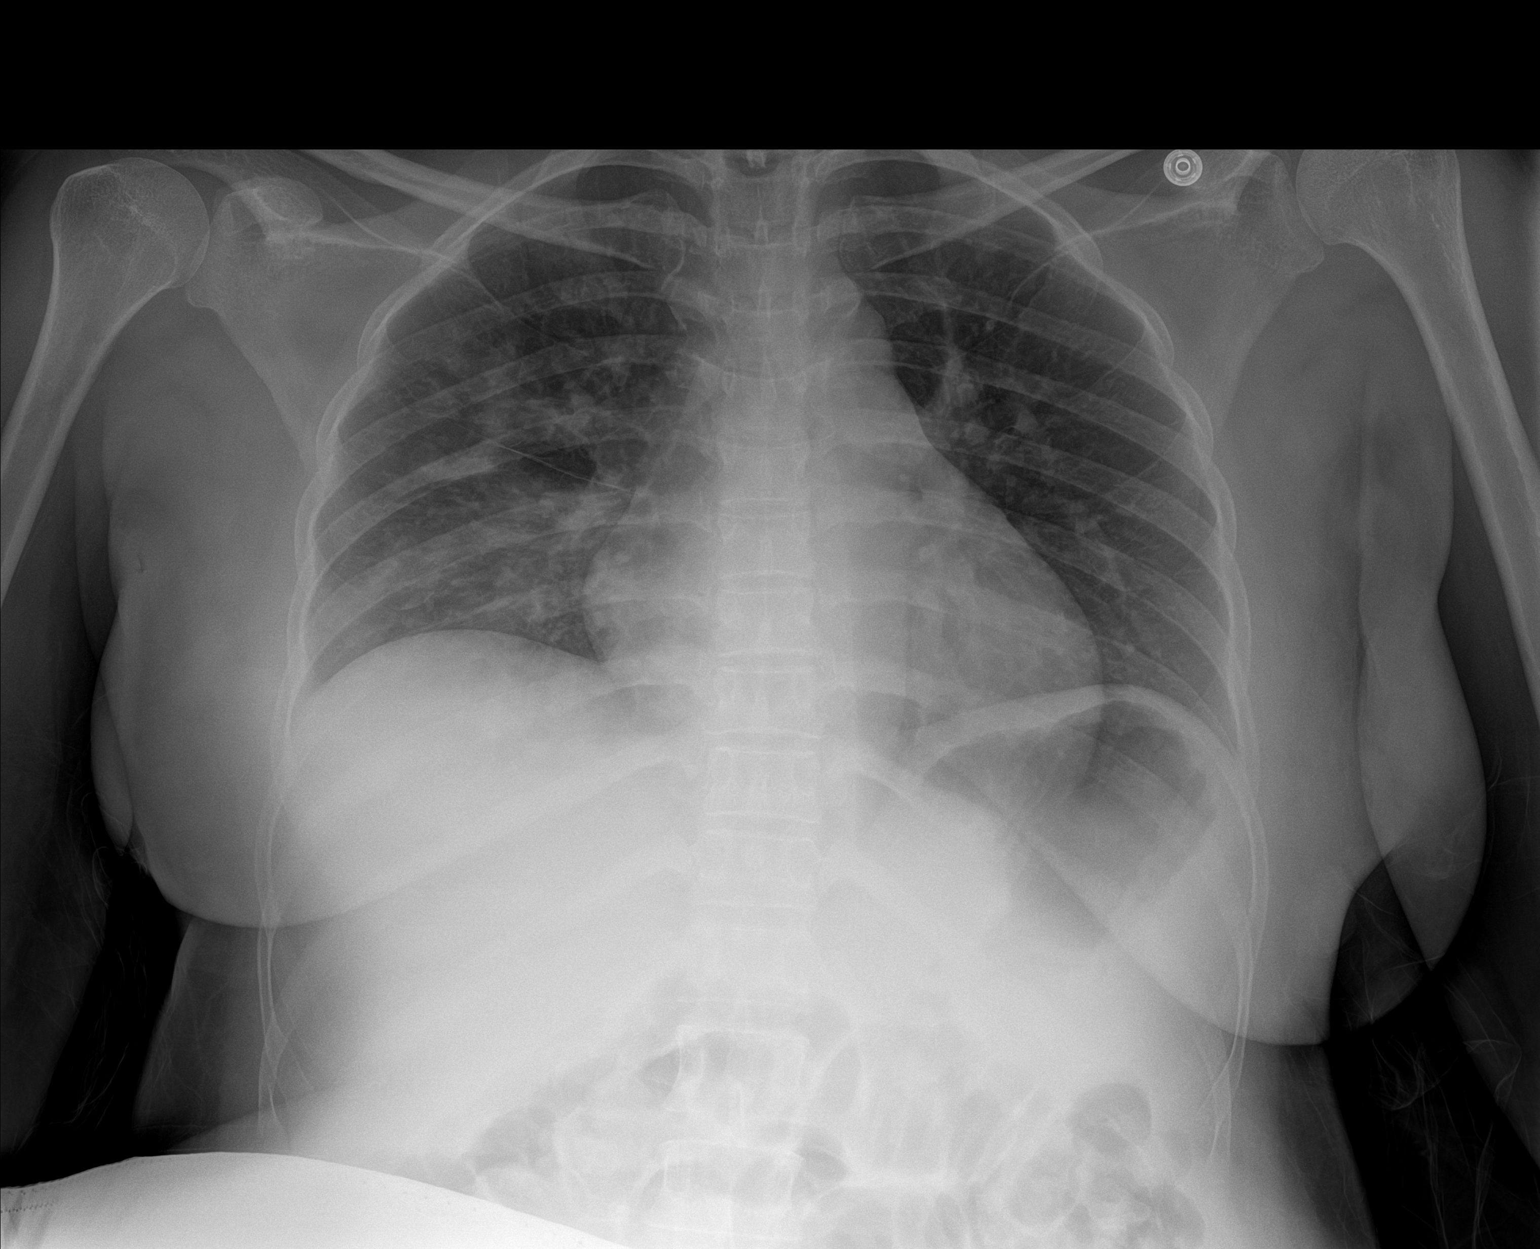

[1 of 1 positions shown; findings below may reference images not displayed]

FINDINGS: Shallow inspiration. Heart size and pulmonary vascularity are
normal. Patchy infiltration in the perihilar region of the right
lung may reflect pneumonia. Left lung is clear. No blunting of
costophrenic angles. No pneumothorax. Mediastinal contours appear
intact.
IMPRESSION: Patchy infiltration in the perihilar region of the right lung may
reflect pneumonia.

Hazy perihilar infiltration on the right may represent pneumonia.
